# Patient Record
Sex: Female | Born: 1995 | Race: White | Hispanic: No | Marital: Single | Smoking: Never smoker
Health system: Southern US, Community
[De-identification: ages and names within clinical notes are randomized; demographics above are authoritative.]

## PROBLEM LIST (undated history)

## (undated) DIAGNOSIS — F419 Anxiety disorder, unspecified: Secondary | ICD-10-CM

## (undated) DIAGNOSIS — F5000 Anorexia nervosa, unspecified: Secondary | ICD-10-CM

## (undated) HISTORY — PX: DENTAL SURGERY: SHX609

## (undated) HISTORY — DX: Anorexia nervosa, unspecified: F50.00

## (undated) HISTORY — PX: BONE MARROW BIOPSY: SHX199

---
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
## 2017-12-29 ENCOUNTER — Other Ambulatory Visit: Payer: Self-pay

## 2017-12-29 ENCOUNTER — Emergency Department (HOSPITAL_BASED_OUTPATIENT_CLINIC_OR_DEPARTMENT_OTHER): Payer: BLUE CROSS/BLUE SHIELD

## 2017-12-29 ENCOUNTER — Encounter (HOSPITAL_BASED_OUTPATIENT_CLINIC_OR_DEPARTMENT_OTHER): Payer: Self-pay | Admitting: *Deleted

## 2017-12-29 ENCOUNTER — Emergency Department (HOSPITAL_BASED_OUTPATIENT_CLINIC_OR_DEPARTMENT_OTHER)
Admission: EM | Admit: 2017-12-29 | Discharge: 2017-12-30 | Disposition: A | Discharge status: Home / Self Care | Payer: BLUE CROSS/BLUE SHIELD | Attending: Emergency Medicine | Admitting: Emergency Medicine

## 2017-12-29 DIAGNOSIS — R070 Pain in throat: Secondary | ICD-10-CM | POA: Insufficient documentation

## 2017-12-29 DIAGNOSIS — J111 Influenza due to unidentified influenza virus with other respiratory manifestations: Secondary | ICD-10-CM

## 2017-12-29 DIAGNOSIS — R112 Nausea with vomiting, unspecified: Secondary | ICD-10-CM | POA: Insufficient documentation

## 2017-12-29 DIAGNOSIS — R6883 Chills (without fever): Secondary | ICD-10-CM | POA: Diagnosis not present

## 2017-12-29 DIAGNOSIS — R05 Cough: Secondary | ICD-10-CM | POA: Insufficient documentation

## 2017-12-29 DIAGNOSIS — R51 Headache: Secondary | ICD-10-CM | POA: Diagnosis present

## 2017-12-29 DIAGNOSIS — M791 Myalgia, unspecified site: Secondary | ICD-10-CM | POA: Diagnosis not present

## 2017-12-29 DIAGNOSIS — Z79899 Other long term (current) drug therapy: Secondary | ICD-10-CM | POA: Insufficient documentation

## 2017-12-29 HISTORY — DX: Anxiety disorder, unspecified: F41.9

## 2017-12-29 LAB — CBC WITH DIFFERENTIAL/PLATELET
BASOS ABS: 0 10*3/uL (ref 0.0–0.1)
Basophils Relative: 0 %
EOS ABS: 0 10*3/uL (ref 0.0–0.7)
Eosinophils Relative: 0 %
HCT: 41.7 % (ref 36.0–46.0)
HEMOGLOBIN: 15.4 g/dL — AB (ref 12.0–15.0)
Lymphocytes Relative: 8 %
Lymphs Abs: 0.7 10*3/uL (ref 0.7–4.0)
MCH: 31 pg (ref 26.0–34.0)
MCHC: 36.9 g/dL — AB (ref 30.0–36.0)
MCV: 84.1 fL (ref 78.0–100.0)
MONO ABS: 0.8 10*3/uL (ref 0.1–1.0)
MONOS PCT: 9 %
NEUTROS ABS: 7.1 10*3/uL (ref 1.7–7.7)
NEUTROS PCT: 83 %
Platelets: 196 10*3/uL (ref 150–400)
RBC: 4.96 MIL/uL (ref 3.87–5.11)
RDW: 12.9 % (ref 11.5–15.5)
WBC: 8.6 10*3/uL (ref 4.0–10.5)

## 2017-12-29 LAB — BASIC METABOLIC PANEL
Anion gap: 12 (ref 5–15)
BUN: 10 mg/dL (ref 6–20)
CO2: 22 mmol/L (ref 22–32)
CREATININE: 0.78 mg/dL (ref 0.44–1.00)
Calcium: 9.2 mg/dL (ref 8.9–10.3)
Chloride: 99 mmol/L — ABNORMAL LOW (ref 101–111)
GFR calc Af Amer: >60 mL/min (ref >60)
GFR calc non Af Amer: >60 mL/min (ref >60)
GLUCOSE: 172 mg/dL — AB (ref 65–99)
POTASSIUM: 3.7 mmol/L (ref 3.5–5.1)
SODIUM: 133 mmol/L — AB (ref 135–145)

## 2017-12-29 LAB — URINALYSIS, ROUTINE W REFLEX MICROSCOPIC
BILIRUBIN URINE: NEGATIVE
GLUCOSE, UA: NEGATIVE mg/dL
Ketones, ur: NEGATIVE mg/dL
Nitrite: NEGATIVE
PROTEIN: 30 mg/dL — AB
Specific Gravity, Urine: <1.005 — ABNORMAL LOW (ref 1.005–1.030)
pH: 6.5 (ref 5.0–8.0)

## 2017-12-29 LAB — URINALYSIS, MICROSCOPIC (REFLEX)

## 2017-12-29 LAB — HEPATIC FUNCTION PANEL
ALBUMIN: 3.8 g/dL (ref 3.5–5.0)
ALT: 12 U/L — AB (ref 14–54)
AST: 24 U/L (ref 15–41)
Alkaline Phosphatase: 63 U/L (ref 38–126)
BILIRUBIN TOTAL: 0.5 mg/dL (ref 0.3–1.2)
Bilirubin, Direct: 0.1 mg/dL (ref 0.1–0.5)
Indirect Bilirubin: 0.4 mg/dL (ref 0.3–0.9)
TOTAL PROTEIN: 7.5 g/dL (ref 6.5–8.1)

## 2017-12-29 LAB — LIPASE, BLOOD: Lipase: 21 U/L (ref 11–51)

## 2017-12-29 LAB — PREGNANCY, URINE: Preg Test, Ur: NEGATIVE

## 2017-12-29 MED ORDER — ONDANSETRON HCL 4 MG/2ML IJ SOLN
4.0000 mg | Freq: Once | INTRAMUSCULAR | Status: AC
  Administered 2017-12-29: 4 mg via INTRAVENOUS
  Filled 2017-12-29: qty 2

## 2017-12-29 MED ORDER — ACETAMINOPHEN 325 MG PO TABS
650.0000 mg | ORAL_TABLET | Freq: Once | ORAL | Status: AC
  Administered 2017-12-29: 650 mg via ORAL
  Filled 2017-12-29: qty 2

## 2017-12-29 MED ORDER — SODIUM CHLORIDE 0.9 % IV SOLN
INTRAVENOUS | Status: DC
  Administered 2017-12-30: via INTRAVENOUS

## 2017-12-29 MED ORDER — SODIUM CHLORIDE 0.9 % IV BOLUS
3000.0000 mL | Freq: Once | INTRAVENOUS | Status: AC
  Administered 2017-12-29: 2000 mL via INTRAVENOUS

## 2017-12-29 NOTE — ED Provider Notes (Signed)
[facility] EMERGENCY DEPARTMENT Provider Note   CSN: [id number] Arrival date & time: 12/29/17  1928     History   Chief Complaint Chief Complaint  Patient presents with  . Influenza    HPI Jade Mcintosh is a 22 y.o. female.  Patient with flulike illness onset on Tuesday.  Patient headache sore throat body aches nausea vomiting no diarrhea chills some diaphoresis cough but nonproductive.  Patient seen on Wednesday had positive flu test and started on Tamiflu.  Patient is not sure whether was influenza A or B.  Review of chart in epic does not confirm one way or the other does also does not show the visit.  Patient since that time is continued to have lots of nausea vomiting chills body aches just cannot get comfortable.  Patient states she had multiple episodes of vomiting.  Temperature upon arrival here was 100.3.  But then it went up significantly to 103.     Past Medical History:  Diagnosis Date  . Anxiety     Patient Active Problem List   Diagnosis Date Noted  . HIP PAIN, LEFT 10/29/2010    History reviewed. No pertinent surgical history.   OB History   None      Home Medications    Prior to Admission medications   Medication Sig Start Date End Date Taking? Authorizing Provider  cholecalciferol (VITAMIN D) 1000 units tablet Take 1,000 Units by mouth daily.   Yes [provider]  naproxen sodium (ALEVE) 220 MG tablet Take 220 mg by mouth.   Yes [provider]  venlafaxine XR (EFFEXOR-XR) 150 MG 24 hr capsule Take 150 mg by mouth daily with breakfast.   Yes [provider]  vitamin B-12 (CYANOCOBALAMIN) 100 MCG tablet Take 100 mcg by mouth daily.   Yes [provider]    Family History No family history on file.  Social History Social History   Tobacco Use  . Smoking status: Never Smoker  . Smokeless tobacco: Never Used  Substance Use Topics  . Alcohol use: Never    Frequency: Never  . Drug use: Never       Allergies   Patient has no known allergies.   Review of Systems Review of Systems  Constitutional: Positive for chills, diaphoresis and fever.  HENT: Positive for congestion and sore throat.   Eyes: Negative for visual disturbance.  Respiratory: Positive for cough. Negative for shortness of breath.   Cardiovascular: Negative for chest pain.  Gastrointestinal: Positive for nausea and vomiting. Negative for abdominal pain and diarrhea.  Genitourinary: Negative for dysuria.  Musculoskeletal: Positive for myalgias. Negative for neck stiffness.  Skin: Negative for rash.  Neurological: Negative for syncope and headaches.  Hematological: Does not bruise/bleed easily.  Psychiatric/Behavioral: Negative for confusion.     Physical Exam Updated Vital Signs BP 113/89 (BP Location: Right Arm)   Pulse (!) 140   Temp (!) 104.3 F (40.2 C) (Rectal)   Resp (!) 24   Ht 1.702 m ( )   Wt 59 kg (130 lb)   LMP 12/22/2017   SpO2 97%   BMI 20.36 kg/m   Physical Exam  Constitutional: She is oriented to person, place, and time. She appears well-developed and well-nourished. No distress.  HENT:  Head: Normocephalic and atraumatic.  Mouth/Throat: No oropharyngeal exudate.  Mucous membranes dry  Eyes: Pupils are equal, round, and reactive to light. Conjunctivae and EOM are normal.  Neck: Neck supple.  Cardiovascular: Normal heart sounds.  Tachycardic  Pulmonary/Chest: Effort normal and breath sounds normal. No respiratory distress. She has no wheezes. She has no rales.  Abdominal: Bowel sounds are normal. There is no tenderness.  Musculoskeletal: Normal range of motion. She exhibits no edema.  Neurological: She is alert and oriented to person, place, and time. A cranial nerve deficit is present. No sensory deficit. She exhibits normal muscle tone. Coordination normal.  Skin: Skin is warm. Capillary refill takes less than 2 seconds. No rash noted. No erythema.  Nursing note and vitals  reviewed.    ED Treatments / Results  Labs (all labs ordered are listed, but only abnormal results are displayed) Labs Reviewed  CBC WITH DIFFERENTIAL/PLATELET - Abnormal; Notable for the following components:      Result Value   Hemoglobin 15.4 (*)    MCHC 36.9 (*)    All other components within normal limits  BASIC METABOLIC PANEL - Abnormal; Notable for the following components:   Sodium 133 (*)    Chloride 99 (*)    Glucose, Bld 172 (*)    All other components within normal limits  URINALYSIS, ROUTINE W REFLEX MICROSCOPIC - Abnormal; Notable for the following components:   Specific Gravity, Urine <1.005 (*)    Hgb urine dipstick LARGE (*)    Protein, ur 30 (*)    Leukocytes, UA MODERATE (*)    All other components within normal limits  URINALYSIS, MICROSCOPIC (REFLEX) - Abnormal; Notable for the following components:   Bacteria, UA FEW (*)    All other components within normal limits  HEPATIC FUNCTION PANEL - Abnormal; Notable for the following components:   ALT 12 (*)    All other components within normal limits  PREGNANCY, URINE  LIPASE, BLOOD    EKG None  Radiology Dg Chest 2 View  Result Date: 12/29/2017 CLINICAL DATA:  Initial evaluation for acute nausea, vomiting, fever. EXAM: CHEST - 2 VIEW COMPARISON:  Prior radiograph from 07/28/2017. FINDINGS: The cardiac and mediastinal silhouettes are stable in size and contour, and remain within normal limits. The lungs are normally inflated. No airspace consolidation, pleural effusion, or pulmonary edema is identified. There is no pneumothorax. No acute osseous abnormality identified. IMPRESSION: No active cardiopulmonary disease. Electronically Signed   By: Rise Mu M.D.   On: 12/29/2017 22:31    Procedures Procedures (including critical care time)  Medications Ordered in ED Medications  0.9 %  sodium chloride infusion (has no administration in time range)  sodium chloride 0.9 % bolus 3,000 mL (2,000 mLs  Intravenous New Bag/Given 12/29/17 2231)  ondansetron (ZOFRAN) injection 4 mg (4 mg Intravenous Given 12/29/17 2236)  acetaminophen (TYLENOL) tablet 650 mg (650 mg Oral Given 12/29/17 2243)     Initial Impression / Assessment and Plan / ED Course  I have reviewed the triage vital signs and the nursing notes.  Pertinent labs & imaging results that were available during my care of the patient were reviewed by me and considered in my medical decision making (see chart for details).     Not able to confirm an influenza test being positive for certainly clinical symptoms seem to be consistent with it.  Patient's chest x-ray negative for pneumonia.  Labs without significant abnormalities no acidosis no significant leukocytosis white blood cell count is in the 8000 range which is normal.  Liver function test without significant abnormalities.  No evidence of urinary tract infection.  Pregnancy test negative.   Patient improved with IV fluids.  She already has Tamiflu to  take at home.  Will make sure she has antinausea medicine.  Follow-up with her primary care doctor recommended returning for any new or worse symptoms.  Final Clinical Impressions(s) / ED Diagnoses   Final diagnoses:  Influenza    ED Discharge Orders    None       Vanetta Mulders, MD 12/30/17 0000

## 2017-12-29 NOTE — ED Triage Notes (Signed)
She was diagnosed with flu 2 days ago. Fever, decreased appetite. Vomiting.

## 2017-12-30 MED ORDER — ONDANSETRON 4 MG PO TBDP: 4.0000 mg | ORAL_TABLET | Freq: Three times a day (TID) | ORAL | 1 refills | Status: DC | PRN

## 2017-12-30 MED ORDER — ONDANSETRON HCL 4 MG/2ML IJ SOLN
4.0000 mg | Freq: Once | INTRAMUSCULAR | Status: AC
  Administered 2017-12-30: 4 mg via INTRAVENOUS
  Filled 2017-12-30: qty 2

## 2017-12-30 NOTE — Discharge Instructions (Signed)
Return for any new or worse symptoms.  Take the Zofran for nausea vomiting.  Would recommend Motrin, Advil, Aleve, Naprosyn for the fevers and body aches.  Small amounts of liquids with sugar then advance to bland diet as tolerated.  School note provided.  Continue to take your Tamiflu.  Rest as much as possible.

## 2017-12-31 ENCOUNTER — Emergency Department (HOSPITAL_BASED_OUTPATIENT_CLINIC_OR_DEPARTMENT_OTHER)
Admission: EM | Admit: 2017-12-31 | Discharge: 2018-01-01 | Disposition: A | Discharge status: Home / Self Care | Payer: BLUE CROSS/BLUE SHIELD | Attending: Emergency Medicine | Admitting: Emergency Medicine

## 2017-12-31 ENCOUNTER — Encounter (HOSPITAL_BASED_OUTPATIENT_CLINIC_OR_DEPARTMENT_OTHER): Payer: Self-pay | Admitting: *Deleted

## 2017-12-31 ENCOUNTER — Other Ambulatory Visit: Payer: Self-pay

## 2017-12-31 ENCOUNTER — Emergency Department (HOSPITAL_BASED_OUTPATIENT_CLINIC_OR_DEPARTMENT_OTHER): Payer: BLUE CROSS/BLUE SHIELD

## 2017-12-31 DIAGNOSIS — M791 Myalgia, unspecified site: Secondary | ICD-10-CM | POA: Diagnosis not present

## 2017-12-31 DIAGNOSIS — R3915 Urgency of urination: Secondary | ICD-10-CM | POA: Insufficient documentation

## 2017-12-31 DIAGNOSIS — R109 Unspecified abdominal pain: Secondary | ICD-10-CM | POA: Diagnosis not present

## 2017-12-31 DIAGNOSIS — R1013 Epigastric pain: Secondary | ICD-10-CM | POA: Diagnosis not present

## 2017-12-31 DIAGNOSIS — R509 Fever, unspecified: Secondary | ICD-10-CM | POA: Diagnosis present

## 2017-12-31 DIAGNOSIS — R300 Dysuria: Secondary | ICD-10-CM | POA: Diagnosis not present

## 2017-12-31 DIAGNOSIS — N12 Tubulo-interstitial nephritis, not specified as acute or chronic: Secondary | ICD-10-CM | POA: Diagnosis not present

## 2017-12-31 DIAGNOSIS — R319 Hematuria, unspecified: Secondary | ICD-10-CM | POA: Diagnosis not present

## 2017-12-31 DIAGNOSIS — R1110 Vomiting, unspecified: Secondary | ICD-10-CM | POA: Diagnosis not present

## 2017-12-31 LAB — CBC WITH DIFFERENTIAL/PLATELET
Basophils Absolute: 0 10*3/uL (ref 0.0–0.1)
Basophils Relative: 0 %
Eosinophils Absolute: 0 10*3/uL (ref 0.0–0.7)
Eosinophils Relative: 0 %
HEMATOCRIT: 32.6 % — AB (ref 36.0–46.0)
HEMOGLOBIN: 11.7 g/dL — AB (ref 12.0–15.0)
LYMPHS PCT: 12 %
Lymphs Abs: 1 10*3/uL (ref 0.7–4.0)
MCH: 31 pg (ref 26.0–34.0)
MCHC: 35.9 g/dL (ref 30.0–36.0)
MCV: 86.2 fL (ref 78.0–100.0)
Monocytes Absolute: 0.7 10*3/uL (ref 0.1–1.0)
Monocytes Relative: 10 %
NEUTROS PCT: 78 %
Neutro Abs: 6.1 10*3/uL (ref 1.7–7.7)
Platelets: 151 10*3/uL (ref 150–400)
RBC: 3.78 MIL/uL — AB (ref 3.87–5.11)
RDW: 13 % (ref 11.5–15.5)
WBC: 7.8 10*3/uL (ref 4.0–10.5)

## 2017-12-31 LAB — PREGNANCY, URINE: PREG TEST UR: NEGATIVE

## 2017-12-31 LAB — COMPREHENSIVE METABOLIC PANEL
ALT: 16 U/L (ref 14–54)
AST: 29 U/L (ref 15–41)
Albumin: 2.9 g/dL — ABNORMAL LOW (ref 3.5–5.0)
Alkaline Phosphatase: 61 U/L (ref 38–126)
Anion gap: 8 (ref 5–15)
BUN: 11 mg/dL (ref 6–20)
CO2: 19 mmol/L — AB (ref 22–32)
Calcium: 7.7 mg/dL — ABNORMAL LOW (ref 8.9–10.3)
Chloride: 105 mmol/L (ref 101–111)
Creatinine, Ser: 0.62 mg/dL (ref 0.44–1.00)
Glucose, Bld: 126 mg/dL — ABNORMAL HIGH (ref 65–99)
POTASSIUM: 3.5 mmol/L (ref 3.5–5.1)
SODIUM: 132 mmol/L — AB (ref 135–145)
Total Bilirubin: 0.4 mg/dL (ref 0.3–1.2)
Total Protein: 5.8 g/dL — ABNORMAL LOW (ref 6.5–8.1)

## 2017-12-31 LAB — I-STAT CG4 LACTIC ACID, ED: LACTIC ACID, VENOUS: 1.62 mmol/L (ref 0.5–1.9)

## 2017-12-31 LAB — URINALYSIS, MICROSCOPIC (REFLEX)
RBC / HPF: >50 RBC/hpf (ref 0–5)
WBC, UA: >50 WBC/hpf (ref 0–5)

## 2017-12-31 LAB — URINALYSIS, ROUTINE W REFLEX MICROSCOPIC
GLUCOSE, UA: 100 mg/dL — AB
Ketones, ur: 15 mg/dL — AB
Nitrite: NEGATIVE
PH: 8 (ref 5.0–8.0)
Protein, ur: >300 mg/dL — AB
SPECIFIC GRAVITY, URINE: 1.02 (ref 1.005–1.030)

## 2017-12-31 MED ORDER — SODIUM CHLORIDE 0.9 % IV SOLN
1.0000 g | Freq: Once | INTRAVENOUS | Status: AC
  Administered 2017-12-31: 1 g via INTRAVENOUS
  Filled 2017-12-31: qty 10

## 2017-12-31 MED ORDER — LACTATED RINGERS IV BOLUS
1000.0000 mL | Freq: Once | INTRAVENOUS | Status: AC
  Administered 2017-12-31: 1000 mL via INTRAVENOUS

## 2017-12-31 MED ORDER — ACETAMINOPHEN 325 MG PO TABS
650.0000 mg | ORAL_TABLET | Freq: Once | ORAL | Status: AC | PRN
  Administered 2017-12-31: 650 mg via ORAL
  Filled 2017-12-31: qty 2

## 2017-12-31 NOTE — ED Provider Notes (Signed)
Emergency Department Provider Note   I have reviewed the triage vital signs and the nursing notes.   HISTORY  Chief Complaint Influenza   HPI Jade Mcintosh is a 22 y.o. female without significant past medical history the presents to the emergency department today with fever, right flank pain.  Patient states that she was seen back on Tuesday in [place] and found to have influenza as she started on Tamiflu but then started having some vomiting.  Her fever came back and her aches are continuous and she came in for evaluation here 2 days ago.  At that time work-up was done, fluids were given and her vital signs seem to improve and she was feeling better however since she is got home she had onset of dysuria, right flank pain, urgency, hematuria and incomplete voiding.  Still having some nausea and epigastric pain as well.  Has had a couple episodes where her body shakes with a fever.  Has had decreased appetite.  No other symptoms. No other associated or modifying symptoms.    Past Medical History:  Diagnosis Date  . Anxiety     Patient Active Problem List   Diagnosis Date Noted  . HIP PAIN, LEFT 10/29/2010    Past Surgical History:  Procedure Laterality Date  . BONE MARROW BIOPSY    . DENTAL SURGERY      Current Outpatient Rx  . Order #: [id number] Class: Historical Med  . Order #: [id number] Class: Historical Med  . Order #: [id number] Class: Print  . Order #: [id number] Class: Historical Med  . Order #: [id number] Class: Print  . Order #: [id number] Class: Print  . Order #: [id number] Class: Historical Med    Allergies Patient has no known allergies.  No family history on file.  Social History Social History   Tobacco Use  . Smoking status: Never Smoker  . Smokeless tobacco: Never Used  Substance Use Topics  . Alcohol use: Never    Frequency: Never  . Drug use: Never    Review of Systems  All other systems negative except as documented in the HPI. All pertinent  positives and negatives as reviewed in the HPI. ____________________________________________   PHYSICAL EXAM:  VITAL SIGNS: ED Triage Vitals  Enc Vitals Group     BP 12/31/17 2059 132/82     Pulse Rate 12/31/17 2059 (!) 144     Resp 12/31/17 2059 14     Temp 12/31/17 2059 (!) 103.3 F (39.6 C)     Temp Source 12/31/17 2059 Oral     SpO2 12/31/17 2059 100 %     Weight 12/31/17 2053 130 lb (59 kg)     Height 12/31/17 2053 '5\' 7"'$  (1.702 m)    Constitutional: Alert and oriented. Well appearing and in no acute distress.  Slight anxiousness. Eyes: Conjunctivae are normal. PERRL. EOMI. Head: Atraumatic. Nose: No congestion/rhinnorhea. Mouth/Throat: Mucous membranes are moist.  Oropharynx non-erythematous. Neck: No stridor.  No meningeal signs.   Cardiovascular: Tachycardic rate, regular rhythm. Good peripheral circulation. Grossly normal heart sounds.   Respiratory: Tachypneic respiratory effort.  No retractions. Lungs CTAB. Gastrointestinal: Soft and nontender. No distention.  Musculoskeletal: No lower extremity tenderness nor edema. No gross deformities of extremities. Neurologic:  Normal speech and language. No gross focal neurologic deficits are appreciated.  Skin:  Skin is hot, moist and intact. No rash noted.   ____________________________________________   LABS (all labs ordered are listed, but only abnormal results are displayed)  Labs Reviewed  COMPREHENSIVE METABOLIC PANEL - Abnormal;  Notable for the following components:      Result Value   Sodium 132 (*)    CO2 19 (*)    Glucose, Bld 126 (*)    Calcium 7.7 (*)    Total Protein 5.8 (*)    Albumin 2.9 (*)    All other components within normal limits  CBC WITH DIFFERENTIAL/PLATELET - Abnormal; Notable for the following components:   RBC 3.78 (*)    Hemoglobin 11.7 (*)    HCT 32.6 (*)    All other components within normal limits  URINALYSIS, ROUTINE W REFLEX MICROSCOPIC - Abnormal; Notable for the following  components:   Color, Urine RED (*)    APPearance CLOUDY (*)    Glucose, UA 100 (*)    Hgb urine dipstick LARGE (*)    Bilirubin Urine SMALL (*)    Ketones, ur 15 (*)    Protein, ur >300 (*)    Leukocytes, UA TRACE (*)    All other components within normal limits  URINALYSIS, MICROSCOPIC (REFLEX) - Abnormal; Notable for the following components:   Bacteria, UA MANY (*)    All other components within normal limits  URINE CULTURE  CULTURE, BLOOD (ROUTINE X 2)  CULTURE, BLOOD (ROUTINE X 2)  PREGNANCY, URINE  I-STAT CG4 LACTIC ACID, ED   ____________________________________________   RADIOLOGY  Dg Chest 2 View  Result Date: 12/31/2017 CLINICAL DATA:  22 year old female with fever and cough. EXAM: CHEST - 2 VIEW COMPARISON:  Chest radiograph dated 12/29/2017 FINDINGS: The heart size and mediastinal contours are within normal limits. Both lungs are clear. The visualized skeletal structures are unremarkable. IMPRESSION: No active cardiopulmonary disease. Electronically Signed   By: Anner Crete M.D.   On: 12/31/2017 22:23    ____________________________________________   PROCEDURES  Procedure(s) performed:   Procedures   ____________________________________________   INITIAL IMPRESSION / ASSESSMENT AND PLAN / ED COURSE  Likely pyelo. Will give fluids/abx/antipyretics. eval cxr to ensure not a worsening pneumonia or developing pneumonia with her cough/cp.   Urine consistent with infection. Culture sent. Fluids started, HR improving. Symptoms improving.   Recheck and abdomen benign. Still some flank ttp c/w pyelonephritis. HR normal. toleratign PO. Will dc on abx/pain meds/nausea meds. A couple days of rest. otherwise stable for discharge and return to normal activities when she feels ready.    Pertinent labs & imaging results that were available during my care of the patient were reviewed by me and considered in my medical decision making (see chart for  details).  ____________________________________________  FINAL CLINICAL IMPRESSION(S) / ED DIAGNOSES  Final diagnoses:  Pyelonephritis     MEDICATIONS GIVEN DURING THIS VISIT:  Medications  acetaminophen (TYLENOL) tablet 650 mg (650 mg Oral Given 12/31/17 2104)  lactated ringers bolus 1,000 mL (0 mLs Intravenous Stopped 01/01/18 0013)  lactated ringers bolus 1,000 mL (0 mLs Intravenous Stopped 01/01/18 0013)  cefTRIAXone (ROCEPHIN) 1 g in sodium chloride 0.9 % 100 mL IVPB (0 g Intravenous Stopped 12/31/17 2245)     NEW OUTPATIENT MEDICATIONS STARTED DURING THIS VISIT:  Discharge Medication List as of 01/01/2018 12:05 AM    START taking these medications   Details  cephALEXin (KEFLEX) 500 MG capsule Take 1 capsule (500 mg total) by mouth 4 (four) times daily., Starting Mon 01/01/2018, Print    ondansetron (ZOFRAN) 4 MG tablet Take 1 tablet (4 mg total) by mouth every 8 (eight) hours as needed for nausea or vomiting., Starting Mon 01/01/2018, Print  Note:  This note was prepared with assistance of Dragon voice recognition software. Occasional wrong-word or sound-a-like substitutions may have occurred due to the inherent limitations of voice recognition software.   Merrily Pew, MD 01/01/18 [phone]

## 2017-12-31 NOTE — ED Triage Notes (Signed)
Pt dx with flu on Wednesday. Seen here Friday night and received NS x 3L. Here today because she still feels bad and she has a new cough and rib pain

## 2017-12-31 NOTE — ED Notes (Signed)
ED Provider at bedside. 

## 2018-01-01 MED ORDER — CEPHALEXIN 500 MG PO CAPS
500.0000 mg | ORAL_CAPSULE | Freq: Four times a day (QID) | ORAL | 0 refills | Status: DC
Start: 2018-01-01 — End: 2018-07-20

## 2018-01-01 MED ORDER — ONDANSETRON HCL 4 MG PO TABS: 4.0000 mg | ORAL_TABLET | Freq: Three times a day (TID) | ORAL | 0 refills | Status: DC | PRN

## 2018-01-03 LAB — URINE CULTURE

## 2018-01-04 ENCOUNTER — Telehealth: Payer: Self-pay | Admitting: *Deleted

## 2018-01-04 NOTE — Progress Notes (Signed)
ED Antimicrobial Stewardship Positive Culture Follow Up   Jade Mcintosh is an 22 y.o. female who presented to [facility] on 12/31/2017 with a chief complaint of  Chief Complaint  Patient presents with  . Influenza    Recent Results (from the past 720 hour(s))  Urine culture     Status: Abnormal   Collection Time: 12/31/17  9:05 PM  Result Value Ref Range Status   Specimen Description   Final    URINE, RANDOM Performed at [facility], [address] [place], [place] [address]    Special Requests   Final    NONE Performed at [facility], [address] [place], [place] [address]    Culture 70,000 COLONIES/mL ENTEROBACTER CLOACAE (A)  Final   Report Status 01/03/2018 FINAL  Final   Organism ID, Bacteria ENTEROBACTER CLOACAE (A)  Final      Susceptibility   Enterobacter cloacae - MIC*    CEFAZOLIN >=64 RESISTANT Resistant     CEFTRIAXONE <=1 SENSITIVE Sensitive     CIPROFLOXACIN <=0.25 SENSITIVE Sensitive     GENTAMICIN <=1 SENSITIVE Sensitive     IMIPENEM 1 SENSITIVE Sensitive     NITROFURANTOIN 32 SENSITIVE Sensitive     TRIMETH/SULFA <=20 SENSITIVE Sensitive     PIP/TAZO <=4 SENSITIVE Sensitive     * 70,000 COLONIES/mL ENTEROBACTER CLOACAE  Blood culture (routine x 2)     Status: None (Preliminary result)   Collection Time: 12/31/17  9:30 PM  Result Value Ref Range Status   Specimen Description   Final    BLOOD RIGHT ARM Performed at [facility], [address] [place], [place] [address]    Special Requests   Final    BOTTLES DRAWN AEROBIC AND ANAEROBIC Blood Culture adequate volume Performed at [facility], [address] [place], [place] [address]    Culture   Final    NO GROWTH 2 DAYS Performed at [facility], [address] [place], [place] [address]    Report Status PENDING  Incomplete  Blood culture (routine x 2)     Status: None (Preliminary result)   Collection Time: 12/31/17 10:11 PM  Result  Value Ref Range Status   Specimen Description   Final    BLOOD LEFT ARM Performed at [facility], [address] [place], [place] [address]    Special Requests   Final    BOTTLES DRAWN AEROBIC AND ANAEROBIC Blood Culture adequate volume Performed at [facility], [address] [place], [place] [address]    Culture   Final    NO GROWTH 2 DAYS Performed at [facility], [address], [place], [place] [address]    Report Status PENDING  Incomplete     Treated with cephalexin, organism resistant to prescribed antimicrobial   New antibiotic prescription: Start cefdinir  PO BID x 7 days  ED Provider: Harvie Heck PA-C   [name] 01/04/2018, 9:17 AM Infectious Diseases Pharmacist Phone# [phone]

## 2018-01-04 NOTE — Telephone Encounter (Signed)
Post ED Visit - Positive Culture Follow-up: Unsuccessful Patient Follow-up  Culture assessed and recommendations reviewed by:   Enzo Bi, Pharm.D.  Celedonio Miyamoto, Pharm.D., BCPS AQ-ID  Garvin Fila, Pharm.D., BCPS  Georgina Pillion, [address].D., BCPS  [place], [address].D., BCPS, AAHIVP  Estella Husk, Pharm.D., BCPS, AAHIVP  Sherlynn Carbon, PharmD  Pollyann Samples, PharmD, BCPS  Positive urine culture, reviewed by Lenora Boys, PA-C   Patient discharged without antimicrobial prescription and treatment is now indicated  Organism is resistant to prescribed ED discharge antimicrobial.  Stop Cephalexin and Start Cefdinir  PO BID x 7 days  Patient with positive blood cultures   Unable to contact patient after 3 attempts, letter will be sent to address on file  Lysle Pearl 01/04/2018, 9:48 AM

## 2018-01-06 LAB — CULTURE, BLOOD (ROUTINE X 2)
CULTURE: NO GROWTH
CULTURE: NO GROWTH
SPECIAL REQUESTS: ADEQUATE
Special Requests: ADEQUATE

## 2018-05-12 ENCOUNTER — Encounter: Payer: Self-pay | Admitting: *Deleted

## 2018-06-01 ENCOUNTER — Ambulatory Visit: Payer: BLUE CROSS/BLUE SHIELD | Admitting: Psychiatry

## 2018-06-01 DIAGNOSIS — F329 Major depressive disorder, single episode, unspecified: Secondary | ICD-10-CM

## 2018-06-01 DIAGNOSIS — F32A Depression, unspecified: Secondary | ICD-10-CM

## 2018-06-01 MED ORDER — SERTRALINE HCL 100 MG PO TABS: 100.0000 mg | ORAL_TABLET | Freq: Every day | ORAL | 2 refills | Status: DC

## 2018-06-01 NOTE — Patient Instructions (Signed)
Patient will increase her Zoloft from 50 to 100 mg.

## 2018-06-01 NOTE — Progress Notes (Signed)
[facility]  Patient ID: Jade Mcintosh,  MRN: [id number]  PCP: Johny Blamer, MD  Date of Evaluation: 06/01/2018 Time spent:20 minutes   Patient is 22 year old white female.  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  Continues to have depression which she feels is situational  does have crying spells, motivation varies denies suicidal thoughts.  Her anxiety has increased also since she is off the [employer].  Patient has started having panic attacks without chest pain.  We were going to start her on lithium.  Patient has not started.  Instead we will increase her Zoloft to 100 mg   HISTORY/CURRENT STATUS: HPI  Individual Medical History/ Review of Systems: Changes? :No  Allergies: Patient has no known allergies.  Current Medications:  Current Outpatient Medications:  .  cholecalciferol (VITAMIN D) 1000 units tablet, Take 1,000 Units by mouth daily., Disp: , Rfl:  .  naproxen sodium (ALEVE) 220 MG tablet, Take 220 mg by mouth., Disp: , Rfl:  .  Norgestimate-Ethinyl Estradiol Triphasic (TRI-SPRINTEC) 0.18/0.215/0.25 MG-35 MCG tablet, Take 1 tablet by mouth daily., Disp: , Rfl:  .  sertraline (ZOLOFT) 100 MG tablet, Take 1 tablet (100 mg total) by mouth daily., Disp: 30 tablet, Rfl: 2 .  vitamin B-12 (CYANOCOBALAMIN) 100 MCG tablet, Take 100 mcg by mouth daily., Disp: , Rfl:  .  cephALEXin (KEFLEX) 500 MG capsule, Take 1 capsule (500 mg total) by mouth 4 (four) times daily. (Patient not taking: Reported on 06/01/2018), Disp: 40 capsule, Rfl: 0 .  ondansetron (ZOFRAN ODT) 4 MG disintegrating tablet, Take 1 tablet (4 mg total) by mouth every 8 (eight) hours as needed. (Patient not taking: Reported on 06/01/2018), Disp: 10 tablet, Rfl: 1 .  ondansetron (ZOFRAN) 4 MG tablet, Take 1 tablet (4 mg total) by mouth every 8 (eight) hours as needed for nausea or vomiting. (Patient not taking: Reported on 06/01/2018), Disp: 12 tablet, Rfl: 0 Medication Side  Effects: None  Family Medical/ Social History: Changes? No  MENTAL HEALTH EXAM:  There were no vitals taken for this visit.There is no height or weight on file to calculate BMI.  General Appearance: Casual  Eye Contact:  Good  Speech:  Normal Rate  Volume:  Normal  Mood:  Euthymic  Affect:  Congruent  Thought Process:  Coherent  Orientation:  Full (Time, Place, and Person)  Thought Content: WDL   Suicidal Thoughts:  No  Homicidal Thoughts:  No  Memory:  Immediate  Judgement:  Good  Insight:  Good  Psychomotor Activity:  Normal  Concentration:  Concentration: Good  Recall:  Good  Fund of Knowledge: Good  Language: Good  Akathisia:  NA  AIMS (if indicated): na  Assets:  Leisure Time Social Support  ADL's:  Intact  Cognition: WNL  Prognosis:  Good    DIAGNOSES:    ICD-10-CM   1. Depression, unspecified depression type F32.9 sertraline (ZOLOFT) 100 MG tablet    RECOMMENDATIONS: Patient is to increase Zoloft to 100 mg a day.  She is to return in 3 months.    Anne Fu, PA-C

## 2018-06-05 ENCOUNTER — Telehealth: Payer: Self-pay | Admitting: Psychiatry

## 2018-06-05 NOTE — Telephone Encounter (Signed)
Ok to switch providers

## 2018-06-05 NOTE — Telephone Encounter (Signed)
Please put chart in my box.  Thanks!

## 2018-06-05 NOTE — Telephone Encounter (Signed)
Pt called she feels she would be more comfortable seeing a  female provider. Ask to switch to Melony Overly from [facility]. Please advise.

## 2018-06-06 ENCOUNTER — Other Ambulatory Visit: Payer: Self-pay

## 2018-06-06 DIAGNOSIS — F329 Major depressive disorder, single episode, unspecified: Secondary | ICD-10-CM

## 2018-06-06 DIAGNOSIS — F32A Depression, unspecified: Secondary | ICD-10-CM

## 2018-06-06 MED ORDER — SERTRALINE HCL 100 MG PO TABS: 100.0000 mg | ORAL_TABLET | Freq: Every day | ORAL | 0 refills | Status: DC

## 2018-06-08 ENCOUNTER — Other Ambulatory Visit: Payer: Self-pay

## 2018-06-08 DIAGNOSIS — F329 Major depressive disorder, single episode, unspecified: Secondary | ICD-10-CM

## 2018-06-08 DIAGNOSIS — F32A Depression, unspecified: Secondary | ICD-10-CM

## 2018-06-08 MED ORDER — SERTRALINE HCL 50 MG PO TABS: 50.0000 mg | ORAL_TABLET | Freq: Every day | ORAL | 0 refills | Status: DC

## 2018-07-04 ENCOUNTER — Other Ambulatory Visit: Payer: Self-pay | Admitting: Psychiatry

## 2018-07-04 ENCOUNTER — Other Ambulatory Visit: Payer: Self-pay

## 2018-07-04 DIAGNOSIS — F32A Depression, unspecified: Secondary | ICD-10-CM

## 2018-07-04 DIAGNOSIS — F329 Major depressive disorder, single episode, unspecified: Secondary | ICD-10-CM

## 2018-07-04 MED ORDER — SERTRALINE HCL 100 MG PO TABS: 100.0000 mg | ORAL_TABLET | Freq: Every day | ORAL | 0 refills | Status: DC

## 2018-07-06 ENCOUNTER — Telehealth: Payer: Self-pay | Admitting: Psychiatry

## 2018-07-06 MED ORDER — SERTRALINE HCL 100 MG PO TABS: 100.0000 mg | ORAL_TABLET | Freq: Every day | ORAL | 0 refills | Status: DC

## 2018-07-06 NOTE — Telephone Encounter (Signed)
eprescribed zoloft 100mg  qd

## 2018-07-09 ENCOUNTER — Other Ambulatory Visit: Payer: Self-pay | Admitting: Psychiatry

## 2018-07-10 ENCOUNTER — Telehealth: Payer: Self-pay | Admitting: Psychiatry

## 2018-07-10 NOTE — Telephone Encounter (Signed)
Spoke with patient today. Just filled Zoloft 100mg /day yesterday. To go ahead and start Lithium for mood stability

## 2018-07-20 ENCOUNTER — Ambulatory Visit: Payer: BLUE CROSS/BLUE SHIELD | Admitting: Physician Assistant

## 2018-07-20 ENCOUNTER — Ambulatory Visit: Payer: BLUE CROSS/BLUE SHIELD | Admitting: Mental Health

## 2018-07-20 ENCOUNTER — Encounter: Payer: Self-pay | Admitting: Physician Assistant

## 2018-07-20 VITALS — BP 121/70 | HR 72 | Ht 67.0 in | Wt 139.0 lb

## 2018-07-20 DIAGNOSIS — F411 Generalized anxiety disorder: Secondary | ICD-10-CM | POA: Insufficient documentation

## 2018-07-20 DIAGNOSIS — F4522 Body dysmorphic disorder: Secondary | ICD-10-CM | POA: Diagnosis not present

## 2018-07-20 DIAGNOSIS — Z789 Other specified health status: Secondary | ICD-10-CM | POA: Insufficient documentation

## 2018-07-20 DIAGNOSIS — F5000 Anorexia nervosa, unspecified: Secondary | ICD-10-CM | POA: Insufficient documentation

## 2018-07-20 MED ORDER — PROPRANOLOL HCL 10 MG PO TABS: 10.0000 mg | ORAL_TABLET | Freq: Two times a day (BID) | ORAL | 1 refills | Status: DC | PRN

## 2018-07-20 MED ORDER — SERTRALINE HCL 100 MG PO TABS: 150.0000 mg | ORAL_TABLET | Freq: Every day | ORAL | 1 refills | Status: DC

## 2018-07-20 NOTE — Progress Notes (Signed)
[facility] MD/PA/NP Initial Note  07/20/2018 2:51 PM Jade Mcintosh  MRN:  [id number]  Chief Complaint:  Chief Complaint    Anxiety; Depression      HPI: Has had episodic anxiety.  But the main reason she's here is b/c of low self-confidence.  At about 22 yrs old, she hated everything about herself.  Thought about having a nose job even at that age. " I thought it would make me feel better.  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 My mom is a flight attendant and gone a lot, so I had to deal with all that on my own.  My dad lost his job that year, and I wasn't eating a lot.  I lost a bunch of weight and didn't have my period for a year and half."  Never binge/purge but restricted calories, but doesn't do that now.  In periods of high stress, will do it sometimes, but nothing like it used to be.    Anxiety is some better.  "I've learned to deal with it, I guess. I think the Zoloft has helped."  Gets test anxiety to the point of shaking, having racing thoughts, and will even throw up a few days before a test or something. Feels that it's warranted most of the time.  If she distracts herself with watching tv or something, and feels better.   Sleeps good. But is tired all the time. 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  Patient denies loss of interest in usual activities and is able to enjoy things.  Has decreased energy and motivation.  But unsure if it's because she's a busy college student or what. Appetite has not changed.  No extreme sadness, but does complain of crying easily at times, or feelings of hopelessness.  Denies any changes in concentration, making decisions or remembering things.  Denies suicidal or homicidal thoughts.  She has seen Dr. Ken Frazier counseling in the past and then another counselor in [place] since she has been at school.  She is felt like neither has been a good fit and would like to try someone else.  States she  knows that therapy is going to be more important for body image issues the medication well.  Visit Diagnosis:    ICD-10-CM   1. Generalized anxiety disorder F41.1   2. 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 Anorexia nervosa   . Anxiety   She is never been hospitalized for any psychiatric reasons.  Past Surgical History:  Procedure Laterality Date  . BONE MARROW BIOPSY    . DENTAL SURGERY      Family Psychiatric History:   Family History:  Family History  Problem Relation Age of Onset  . Depression Brother   Thinks an aunt may have bipolar disorder but never diagnosed.  Social History:  Social History   Socioeconomic History  . Marital status: Single    Spouse name: Not on file  . Number of children: Not on file  . Years of education: Not on file  . Highest education level: 12th grade  Occupational History  . Occupation: student   Social Needs  . Financial resource strain: Not hard at all  . Food insecurity:    Worry: Never true    Inability: Never true  . Transportation needs:      Medical: No    Non-medical: No  Tobacco Use  . Smoking status: Never Smoker  . Smokeless tobacco: Never Used  Substance and Sexual Activity  . Alcohol use: Yes    Frequency: Never    Comment: weekends  . Drug use: Never  . Sexual activity: Not on file  Lifestyle  . Physical activity:    Days per week: 0 days    Minutes per session: 0 min  . Stress: Very much  Relationships  . Social connections:    Talks on phone: More than three times a week    Gets together: More than three times a week    Attends religious service: Not on file    Active member of club or organization: Yes    Attends meetings of clubs or organizations: More than 4 times per year    Relationship status: Never married  Other Topics Concern  . Not on file  Social History Narrative   [employer], studying  Nutrition with a minor in Psychology.    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 Venlafaxine Nausea Only    Sweats    Metabolic Disorder Labs: No results found for: HGBA1C, MPG No results found for: PROLACTIN No results found for: CHOL, TRIG, HDL, CHOLHDL, VLDL, LDLCALC No results found for: TSH  Therapeutic Level Labs: No results found for: LITHIUM No results found for: VALPROATE No components found for:  CBMZ  Current Medications: Current Outpatient Medications  Medication Sig Dispense Refill  . cholecalciferol (VITAMIN D) 1000 units tablet Take 1,000 Units by mouth daily.    . naproxen sodium (ALEVE) 220 MG tablet Take 220 mg by mouth.    . Norgestimate-Ethinyl Estradiol Triphasic (TRI-SPRINTEC) 0.18/0.215/0.25 MG-35 MCG tablet Take 1 tablet by mouth daily.    . vitamin B-12 (CYANOCOBALAMIN) 100 MCG tablet Take 100 mcg by mouth daily.    . propranolol (INDERAL) 10 MG tablet Take 1-2 tablets (10-20 mg total) by mouth 2 (two) times daily as needed. 60 tablet 1  . sertraline (ZOLOFT) 100 MG tablet Take 1.5 tablets (150 mg total) by mouth daily. 45 tablet 1   No current facility-administered medications for this visit.     Medication Side Effects: none  Orders placed this visit:  No orders of the defined types were placed in this encounter.   Psychiatric Specialty Exam:  ROS  There were no vitals taken for this visit.There is no height or weight on file to calculate BMI.  General Appearance: Casual  Eye Contact:  Good  Speech:  Clear and Coherent  Volume:  Normal  Mood:  Euthymic  Affect:  Appropriate  Thought Process:  Goal Directed  Orientation:  Full (Time, Place, and Person)  Thought Content: Logical   Suicidal Thoughts:  No  Homicidal Thoughts:  No  Memory:  WNL  Judgement:  Good  Insight:  Good  Psychomotor Activity:  Normal  Concentration:  Concentration: Good and Attention Span:  Good  Recall:  Good  Fund of Knowledge: Good  Language: Good  Assets:  Desire for Improvement  ADL's:  Intact  Cognition: WNL  Prognosis:  Good   Screenings:  GAD-7     Office Visit from 07/20/2018 in [facility]  Total GAD-7 Score  13    PHQ2-9     Office Visit from 07/20/2018 in [facility]    PHQ-2 Total Score  4  PHQ-9 Total Score  12      Receiving Psychotherapy: No   Treatment Plan/Recommendations: Increase Zoloft to 150 mg daily. Start propranolol 10 mg Sig 1-2 twice daily as needed panic attack.  She knows to watch for hypotension symptoms such as dizziness or weakness.  If that occurs, do not take the medication anymore and call me. Refer for psychotherapy here in our office.  Return in 4 to 6 weeks.     Teresa Hurst, PA-C           

## 2018-07-25 ENCOUNTER — Telehealth: Payer: Self-pay | Admitting: Physician Assistant

## 2018-07-25 ENCOUNTER — Other Ambulatory Visit: Payer: Self-pay

## 2018-07-25 MED ORDER — PROPRANOLOL HCL 10 MG PO TABS: 10.0000 mg | ORAL_TABLET | Freq: Two times a day (BID) | ORAL | 0 refills | Status: DC | PRN

## 2018-07-25 NOTE — Telephone Encounter (Signed)
Pt called she is home from college.She left her propranolol 10 mg tabs 1-2 prn  for anxiety. [facility] [phone]. She is home for 5 days. Having a lot of anxiety.

## 2018-07-25 NOTE — Telephone Encounter (Signed)
Left voicemail to check with pharmacy and let her know insurance might not cover since she just filled it.

## 2018-07-25 NOTE — Telephone Encounter (Signed)
[facility] for her to pick up some.

## 2018-08-03 ENCOUNTER — Ambulatory Visit: Payer: BLUE CROSS/BLUE SHIELD | Admitting: Mental Health

## 2018-08-08 ENCOUNTER — Ambulatory Visit (INDEPENDENT_AMBULATORY_CARE_PROVIDER_SITE_OTHER): Payer: BLUE CROSS/BLUE SHIELD | Admitting: Mental Health

## 2018-08-08 DIAGNOSIS — F411 Generalized anxiety disorder: Secondary | ICD-10-CM

## 2018-08-08 NOTE — Telephone Encounter (Signed)
ERROR

## 2018-08-08 NOTE — Progress Notes (Signed)
[facility] Counselor Initial Adult Exam- Part I  Name: Jade Mcintosh Date: 08/08/2018 MRN: [id number] DOB: [birth year] PCP: Shirline Frees, MD  Time spent: 53 minutes   Guardian/Payee: none  Paperwork requested:  n/a  Reason for Visit /Presenting Problem:  Pt had been in therapy last year for a couple of sessions. Her brother has a hx of selling drugs and was expelled from college and then attempted suicide, going inpatient to stabilize. Pt developed "somewhat of an eating disorder" as she was a competition dancing, very competitive. She would limit herself to 1300 calories /day but dancing many hours/week and also running. Due to not having her monthly menstrual cycle for about a year, she went to [facility] and was misdiagnosed w/ Poly Cystic Ovarian. She got a 2nd opinion and was told to eat more often and Pt followed through, able to gain some weight.  She still weighs herself and is conscious of her weight.  She has struggled since 7th grade. She stated she is over conscious about her looks, feels she has many unattractive qualities, feels she is unattractive. She researched body dysmorphia and feels she has the symptoms. She stated her past therapy, she was not as disclosing. She is struggling emotionally lately. She has not been to the gym for the past 4 months, feels this has thrown her off. She has had difficult classes and it has taken a lot of her time. She has felt more sad lately, unsure this is b/c of school demands or the medication as she had a recent mg increase (zoloft). She is taking Propanolol to decrease anxiety. She has a 4.0gpa in school, she pushes herself often. She is majoring in Occupational psychologist. She was a vegan at age 5 and is today. She is trying to find a balance w/ her healthy eating habits but worries when she eats something that is not healthy "am I doing this too much". The anxiety , started at age 62.  Family hx for MH- Brother, gfM- self critical about her weight in the  past. Raised by gfM as mother was a Catering manager.    Mental Status Exam:   Appearance:   Casual     Behavior:  Appropriate  Motor:  Normal  Speech/Language:   Clear and Coherent  Affect:  anxious  Mood:  congruent  Thought process:  normal  Thought content:    WNL  Sensory/Perceptual disturbances:    WNL  Orientation:  oriented to person, place, time/date and situation  Attention:  NA  Concentration:  Good  Memory:  WNL  Fund of knowledge:   Good  Insight:    Good  Judgment:   Good  Impulse Control:  Good   Reported Symptoms:  Anxiety, negative self talk, shaming self,   Risk Assessment: Danger to Self:  No Self-injurious Behavior: No Danger to Others: No Duty to Warn:no Physical Aggression / Violence:No  Access to Firearms a concern: No  Gang Involvement:No  Patient / guardian was educated about steps to take if suicide or homicide risk level increases between visits: yes While future psychiatric events cannot be accurately predicted, the patient does not currently require acute inpatient psychiatric care and does not currently meet [facility] involuntary commitment criteria.  Substance Abuse History: Current substance abuse: No     Past Psychiatric History:   Previous psychological history is significant for anxiety Outpatient Providers:Ken Ginger Carne - past therapists History of Psych Hospitalization: No  Psychological Testing: none   Medical History/Surgical History:reviewed  Past Medical History:  Diagnosis Date  . Anorexia nervosa   . Anxiety     Past Surgical History:  Procedure Laterality Date  . BONE MARROW BIOPSY    . DENTAL SURGERY      Medications: Current Outpatient Medications  Medication Sig Dispense Refill  . cholecalciferol (VITAMIN D) 1000 units tablet Take 1,000 Units by mouth daily.    . naproxen sodium (ALEVE) 220 MG tablet Take 220 mg by mouth.    . Norgestimate-Ethinyl Estradiol Triphasic (TRI-SPRINTEC)  0.18/0.215/0.25 MG-35 MCG tablet Take 1 tablet by mouth daily.    . propranolol (INDERAL) 10 MG tablet Take 1-2 tablets (10-20 mg total) by mouth 2 (two) times daily as needed. 60 tablet 0  . sertraline (ZOLOFT) 100 MG tablet Take 1.5 tablets (150 mg total) by mouth daily. 45 tablet 1  . vitamin B-12 (CYANOCOBALAMIN) 100 MCG tablet Take 100 mcg by mouth daily.     No current facility-administered medications for this visit.     Allergies  Allergen Reactions  . Venlafaxine Nausea Only    Sweats    Diagnoses:    ICD-10-CM   1. Generalized anxiety disorder F41.1      Part II to be continued at next session:   yes   Anson [place], [facility]

## 2018-08-14 ENCOUNTER — Ambulatory Visit: Payer: BLUE CROSS/BLUE SHIELD | Admitting: Mental Health

## 2018-08-19 ENCOUNTER — Encounter: Payer: Self-pay | Admitting: Emergency Medicine

## 2018-08-19 DIAGNOSIS — F39 Unspecified mood [affective] disorder: Secondary | ICD-10-CM | POA: Insufficient documentation

## 2018-08-20 ENCOUNTER — Ambulatory Visit (INDEPENDENT_AMBULATORY_CARE_PROVIDER_SITE_OTHER): Payer: BLUE CROSS/BLUE SHIELD | Admitting: Mental Health

## 2018-08-20 ENCOUNTER — Ambulatory Visit: Payer: BLUE CROSS/BLUE SHIELD | Admitting: Mental Health

## 2018-08-20 DIAGNOSIS — F411 Generalized anxiety disorder: Secondary | ICD-10-CM | POA: Diagnosis not present

## 2018-08-20 NOTE — Progress Notes (Signed)
     Abuse History: Victim: none Report needed: no Perpetrator of abuse: no Witness / Exposure to Domestic Violence:  none Protective Services Involvement: no Witness to [employer] Violence:  no   Family / Social History:    Living situation: lives w/ 3 roommates at college; at home - pt, mother - Insurance account managerenee, father- Medical sales representativemichael and brother - Will- age 22 Sexual Orientation:  Relationship Status:   none Name of spouse / other: none If a parent, number of children / ages:   none  Support Systems: parents, friends  Surveyor, quantityinancial Stress:  none  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

## 2018-08-20 NOTE — Progress Notes (Signed)
Psychotherapy Note  Name: Jade Mcintosh Date: 08/20/18 MRN: [id number] DOB: [birth year] PCP: Shirline Frees, MD  Time spent: 53 minutes  Treatment: Family therapy  Mental Status Exam:   Appearance:   Casual     Behavior:  Appropriate  Motor:  Normal  Speech/Language:   Clear and Coherent  Affect:  anxious  Mood:  congruent  Thought process:  normal  Thought content:    WNL  Sensory/Perceptual disturbances:    WNL  Orientation:  oriented to person, place, time/date and situation  Attention:  NA  Concentration:  Good  Memory:  WNL  Fund of knowledge:   Good  Insight:    Good  Judgment:   Good  Impulse Control:  Good   Reported Symptoms:  Anxiety, negative self talk, shaming self, depressed mood at times, rumination  Risk Assessment: Danger to Self:  No Self-injurious Behavior: No Danger to Others: No Duty to Warn:no Physical Aggression / Violence:No  Access to Firearms a concern: No  Gang Involvement:No  Patient / guardian was educated about steps to take if suicide or homicide risk level increases between visits: yes While future psychiatric events cannot be accurately predicted, the patient does not currently require acute inpatient psychiatric care and does not currently meet [facility] involuntary commitment criteria.  Subjective: Met with patient, husband and accompanied patient per her request and to session.  Discussed progress with communication.  Husband continues to struggle with trust in the relationship due to his wife's history of infidelity.  He stated that they have been getting along better over the past week or 2, sharing some experiences.  She shared how she continues to notice that he struggles to trust her, has checking behaviors giving some examples.  They stated they have struggled to have "check-in's" at least weekly to talk through issues about the relationship and about how they were doing individually.  Both agreed to go for following through  in this process.  Assisted them in communication throughout the session.  Medical History/Surgical History:reviewed Past Medical History:  Diagnosis Date  . Anorexia nervosa   . Anxiety     Past Surgical History:  Procedure Laterality Date  . BONE MARROW BIOPSY    . DENTAL SURGERY      Medications: Current Outpatient Medications  Medication Sig Dispense Refill  . carbamazepine (EQUETRO) 200 MG CP12 12 hr capsule Take 200 mg by mouth at bedtime.    . cholecalciferol (VITAMIN D) 1000 units tablet Take 1,000 Units by mouth daily.    Marland Kitchen lithium carbonate 300 MG capsule Take 300 mg by mouth. 1/d x 1wk, then 2/d    . naproxen sodium (ALEVE) 220 MG tablet Take 220 mg by mouth.    . Norgestimate-Ethinyl Estradiol Triphasic (TRI-SPRINTEC) 0.18/0.215/0.25 MG-35 MCG tablet Take 1 tablet by mouth daily.    . propranolol (INDERAL) 10 MG tablet Take 1-2 tablets (10-20 mg total) by mouth 2 (two) times daily as needed. 60 tablet 0  . sertraline (ZOLOFT) 100 MG tablet Take 1.5 tablets (150 mg total) by mouth daily. 45 tablet 1  . vitamin B-12 (CYANOCOBALAMIN) 100 MCG tablet Take 100 mcg by mouth daily.     No current facility-administered medications for this visit.     Allergies  Allergen Reactions  . Venlafaxine Nausea Only    Sweats    Diagnoses:    ICD-10-CM   1. Generalized anxiety disorder F41.1      Plan:  1.  Patient to continue to engage in  individual counseling 2-4 times a month or as needed. 2.  Patient to identify and apply CBT, coping skills learned in session to decrease depression and anxiety symptoms. 3.  Patient and husband to improve communication to strengthen their relationship. 3.  Patient to contact this office, go to the local ED or call 911 if a crisis or emergency develops between visits.   Anson [place], [facility]

## 2018-08-31 ENCOUNTER — Ambulatory Visit: Payer: BLUE CROSS/BLUE SHIELD | Admitting: Psychiatry

## 2018-09-07 ENCOUNTER — Ambulatory Visit (INDEPENDENT_AMBULATORY_CARE_PROVIDER_SITE_OTHER): Payer: BLUE CROSS/BLUE SHIELD | Admitting: Physician Assistant

## 2018-09-07 ENCOUNTER — Encounter: Payer: Self-pay | Admitting: Physician Assistant

## 2018-09-07 VITALS — BP 125/83 | HR 90

## 2018-09-07 DIAGNOSIS — F411 Generalized anxiety disorder: Secondary | ICD-10-CM

## 2018-09-07 DIAGNOSIS — F329 Major depressive disorder, single episode, unspecified: Secondary | ICD-10-CM

## 2018-09-07 DIAGNOSIS — F32A Depression, unspecified: Secondary | ICD-10-CM

## 2018-09-07 MED ORDER — SERTRALINE HCL 100 MG PO TABS: 150.0000 mg | ORAL_TABLET | Freq: Every day | ORAL | 2 refills | Status: DC

## 2018-09-07 MED ORDER — PROPRANOLOL HCL 10 MG PO TABS: 10.0000 mg | ORAL_TABLET | Freq: Two times a day (BID) | ORAL | 2 refills | Status: DC | PRN

## 2018-09-07 NOTE — Progress Notes (Signed)
[facility] Med Check  Patient ID: Jade Mcintosh,  MRN: [id number]  PCP: Johny Blamer, MD  Date of Evaluation: 09/07/2018 Time spent:15 minutes  Chief Complaint:  Chief Complaint    Follow-up      HISTORY/CURRENT STATUS: HPI here for routine med check after starting propranolol.  Patient states she is doing very well.  Propranolol has really helped with the anxiety.  For a few days, she took it routinely, during exam time at school.  It really helped a lot.  Now she is only using it as needed.  She did notice some mild dizziness and seeing spots in front of her eyes on a rare occasion when she stood up quickly.  Dates that was going on prior to taking the propranolol though.  Panic attacks are rare now and are not as severe.  She is also doing well with the increase in Zoloft.  Patient denies loss of interest in usual activities and is able to enjoy things.  Denies decreased energy or motivation.  Appetite has not changed.  No extreme sadness, tearfulness, or feelings of hopelessness.  Denies any changes in concentration, making decisions or remembering things.  Denies suicidal or homicidal thoughts.  In early March, she is having a "nose job."  States her Careers adviser is requesting a note from me stating that mentally it is fine for her to have the surgery.  I think she is stable and certainly able to make that decision.  I will send a note to that effect.  She has started her new semester at [employer] state.  Things are going well there.  Individual Medical History/ Review of Systems: Changes? :No   Allergies: Venlafaxine  Current Medications:  Current Outpatient Medications:  .  cholecalciferol (VITAMIN D) 1000 units tablet, Take 1,000 Units by mouth daily., Disp: , Rfl:  .  naproxen sodium (ALEVE) 220 MG tablet, Take 220 mg by mouth., Disp: , Rfl:  .  Norgestimate-Ethinyl Estradiol Triphasic (TRI-SPRINTEC) 0.18/0.215/0.25 MG-35 MCG tablet, Take 1 tablet by mouth daily., Disp: ,  Rfl:  .  propranolol (INDERAL) 10 MG tablet, Take 1-2 tablets (10-20 mg total) by mouth 2 (two) times daily as needed., Disp: 60 tablet, Rfl: 2 .  sertraline (ZOLOFT) 100 MG tablet, Take 1.5 tablets (150 mg total) by mouth daily., Disp: 45 tablet, Rfl: 2 .  vitamin B-12 (CYANOCOBALAMIN) 100 MCG tablet, Take 100 mcg by mouth daily., Disp: , Rfl:  Medication Side Effects: none  Family Medical/ Social History: Changes? No  MENTAL HEALTH EXAM:  Blood pressure 125/83, pulse 90.There is no height or weight on file to calculate BMI.  General Appearance: Casual and Well Groomed  Eye Contact:  Good  Speech:  Clear and Coherent  Volume:  Normal  Mood:  Euthymic  Affect:  Appropriate  Thought Process:  Goal Directed  Orientation:  Full (Time, Place, and Person)  Thought Content: Logical   Suicidal Thoughts:  No  Homicidal Thoughts:  No  Memory:  WNL  Judgement:  Good  Insight:  Good  Psychomotor Activity:  Normal  Concentration:  Concentration: Good  Recall:  Good  Fund of Knowledge: Good  Language: Good  Assets:  Desire for Improvement  ADL's:  Intact  Cognition: WNL  Prognosis:  Good    DIAGNOSES:    ICD-10-CM   1. Generalized anxiety disorder F41.1   2. Depression, unspecified depression type F32.9     Receiving Psychotherapy: Yes Elio Forget, [facility]   RECOMMENDATIONS: I am glad she is  doing so well! Continue Zoloft 150 mg p.o. every morning. Continue propranolol 10 to 20 mg twice daily as needed.  Watch for orthostatic symptoms and we discussed the fact that she needs to sit up for a few seconds and then stand up for a few seconds before walking across the room in order to prevent she verbalizes understanding.  Another thing she should take only 1 of the propranolol and not 2 at a time right off the bat.  10 mg may be enough to help with the panic and not cause dizziness.  She understands. I will send a note to her surgeon Dr. Rosine Beat at fax number [phone], stating  that mentally she is okay to have plastic surgery. Continue psychotherapy with Elio Forget, [facility] or someone at [facility] state. Return in 3 months.  Melony Overly, PA-C

## 2018-09-11 ENCOUNTER — Encounter: Payer: Self-pay | Admitting: Physician Assistant

## 2018-09-13 ENCOUNTER — Telehealth: Payer: Self-pay | Admitting: Physician Assistant

## 2018-09-14 NOTE — Telephone Encounter (Signed)
Spoke w/ Dr. Neale Burly, plastic surgeon, about patient's desire for cosmetic nasal surgery.  He's concerned about BDS.  Will fax me questionaire he had her do and would like for me to assess further. I'll have her come in to discuss.

## 2018-09-24 ENCOUNTER — Telehealth: Payer: Self-pay | Admitting: Physician Assistant

## 2018-09-24 NOTE — Telephone Encounter (Signed)
Dr. Neale BurlyFreeman office callled and said that they are waiting for you to call them back about Jade Mcintosh. Her surgery is scheduled and needs your feedback. Please call the office at [phone].

## 2018-09-26 NOTE — Telephone Encounter (Signed)
Maralyn Sago, can you call the pt and get her in to see me at the earliest possible appt.  Then let Dr. Onnie Boer office know when the appt is.  I can't make a determination of her dx without seeing her again. Thanks

## 2018-10-03 ENCOUNTER — Encounter: Payer: Self-pay | Admitting: Physician Assistant

## 2018-10-03 ENCOUNTER — Telehealth: Payer: Self-pay | Admitting: Physician Assistant

## 2018-10-03 NOTE — Telephone Encounter (Signed)
The form is in your box for review

## 2018-10-03 NOTE — Telephone Encounter (Signed)
Traci please call Dr. 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 Neale Burly about this.)  Due to scheduling, I doubt I'll be able to get her in before the surgery is scheduled in early March (what the pt told me).  At this point, since the pt has never presented to me problems indicating body dysmorphia, I say go ahead with the surgery.  I don't have her chart but don't remember a form I was supposed to complete, only a questionair that he had pt do to r/o BDS.  Does he need just a note on Rx saying I okay her surgery or what?

## 2018-10-03 NOTE — Telephone Encounter (Signed)
Spoke with Jade Mcintosh and they will refax form, it was sent on 09/14/2018

## 2018-10-03 NOTE — Telephone Encounter (Signed)
I'm sending letter to Dr. Neale Burly.

## 2018-10-03 NOTE — Telephone Encounter (Signed)
Carney Bern from Dr. Sheralyn Boatman office left a vm stating the patient is having surgey did not say when, patient needs clearance form for clearance was faxed, Carney Bern did not specify when he form was sent.  Please contact Dr. Onnie Boer office at [phone]

## 2018-10-05 ENCOUNTER — Telehealth: Payer: Self-pay | Admitting: Physician Assistant

## 2018-10-05 NOTE — Telephone Encounter (Signed)
Mom Renee called. She and Twinkle are very upset concerning a letter that was sent to the surgeon. On her 1/10 visit the impression that was given  you were on board for her pending surgery That was precisely what the visit was for.Schedules were manipulated to accommodate the procedure. Please called ASAP when you are back in the office. Tentatively an appointment has been made, but this is a 3hr trip that seems to be a repeat of 1/10 I hope she doesn't need to take this trip. Please call Renee ASAP  [phone]

## 2018-10-08 NOTE — Telephone Encounter (Signed)
Please review

## 2018-10-15 ENCOUNTER — Ambulatory Visit: Payer: BLUE CROSS/BLUE SHIELD | Admitting: Physician Assistant

## 2018-11-02 ENCOUNTER — Ambulatory Visit: Payer: BLUE CROSS/BLUE SHIELD | Admitting: Psychiatry

## 2018-11-09 ENCOUNTER — Other Ambulatory Visit: Payer: Self-pay

## 2018-11-09 ENCOUNTER — Encounter: Payer: Self-pay | Admitting: Psychiatry

## 2018-11-09 ENCOUNTER — Ambulatory Visit (INDEPENDENT_AMBULATORY_CARE_PROVIDER_SITE_OTHER): Payer: BLUE CROSS/BLUE SHIELD | Admitting: Psychiatry

## 2018-11-09 DIAGNOSIS — F411 Generalized anxiety disorder: Secondary | ICD-10-CM | POA: Diagnosis not present

## 2018-11-09 NOTE — Progress Notes (Signed)
      [facility] Counselor/Therapist Progress Note  Patient ID: Jade Mcintosh, MRN: [id number],    Date: 11/09/2018  Time Spent: 51 minutes  Treatment Type: Individual Therapy  Reported Symptoms: anxiety, sleep issues, eating issues, body issues , obsessive thinking  Mental Status Exam:  Appearance:   Well Groomed     Behavior:  Appropriate  Motor:  Restlestness  Speech/Language:   Normal Rate  Affect:  Appropriate  Mood:  anxious  Thought process:  normal  Thought content:    Obsessions  Sensory/Perceptual disturbances:    WNL  Orientation:  oriented to person, place, time/date and situation  Attention:  Fair  Concentration:  Fair  Memory:  WNL  Fund of knowledge:   Fair  Insight:    Fair  Judgment:   Fair  Impulse Control:  Fair   Risk Assessment: Danger to Self:  No Self-injurious Behavior: No Danger to Others: No Duty to Warn:no Physical Aggression / Violence:No  Access to Firearms a concern: No  Gang Involvement:No   Subjective: Patient was present for session.  She reported she is coming to treatment due to anxiety.  She shared that she is currently at [employer] and she has had a hard time making friends there.  She shared she has friends at [facility] and goes there on the weekends or home because she just doesn't want to be there.  She shared that the last few years have been very difficult.  She is currently in a bad situation with roommates.  Pt shared she has had therapy off and on for years.  Pt stated her senior year in high school dad lost his job, brother went to [employer] and things fell apart- tried to hurt himself and it was very bad.  Brother is back in school and still using and there are still on going problems.  Developed treatment plan in session after gathering some of the background information from patient.  It was agreed that the anxiety would be focused on in treatment at this time but the eating issues will be monitored.  Patient was encouraged  to start focusing on just taking deep breaths and recognizing her automatic negative thoughts.  More coping skills and CBT will be started with patient at next session  Interventions: Solution-Oriented/Positive Psychology  Diagnosis:   ICD-10-CM   1. Generalized anxiety disorder F41.1     Plan: 1.  Patient to continue to engage in individual counseling 2-4 times a month or as needed. 2.  Patient to identify and apply CBT, coping skills learned in session to decrease anxiety symptoms. 3.  Patient to contact this office, go to the local ED or call 911 if a crisis or emergency develops between visits.  Stevphen Meuse, [place]

## 2018-11-30 ENCOUNTER — Ambulatory Visit: Payer: BLUE CROSS/BLUE SHIELD | Admitting: Physician Assistant

## 2018-11-30 ENCOUNTER — Ambulatory Visit: Payer: BLUE CROSS/BLUE SHIELD | Admitting: Psychiatry

## 2018-12-14 ENCOUNTER — Ambulatory Visit (INDEPENDENT_AMBULATORY_CARE_PROVIDER_SITE_OTHER): Payer: BLUE CROSS/BLUE SHIELD | Admitting: Psychiatry

## 2018-12-14 ENCOUNTER — Other Ambulatory Visit: Payer: Self-pay

## 2018-12-14 DIAGNOSIS — F411 Generalized anxiety disorder: Secondary | ICD-10-CM

## 2018-12-14 NOTE — Progress Notes (Signed)
[facility] Counselor/Therapist Progress Note  Patient ID: Jade Mcintosh, MRN: [id number],    Date: 12/16/2018  Time Spent: 49 minutes  Start time 3:02 PM End time 3:51 PM  Virtual Visit via Telephone Note I connected with patient by a video enabled telemedicine application or telephone, with their informed consent, and verified patient privacy and that I am speaking with the correct person using two identifiers. I discussed the limitations, risks, security and privacy concerns of performing psychotherapy and management service by telephone and the availability of in person appointments. I also discussed with the patient that there may be a patient responsible charge related to this service. The patient expressed understanding and agreed to proceed. I discussed the treatment planning with the patient. The patient was provided an opportunity to ask questions and all were answered. The patient agreed with the plan and demonstrated an understanding of the instructions. The patient was advised to call  our office if  symptoms worsen or feel they are in a crisis state and need immediate contact. Patient was at home and clinician was at her home Treatment Type: Individual Therapy  Reported Symptoms: anxiety, irritability  Mental Status Exam:  Appearance:   NA     Behavior:  Sharing  Motor:  NA  Speech/Language:   Normal Rate  Affect:  NA  Mood:  normal  Thought process:  normal  Thought content:    WNL  Sensory/Perceptual disturbances:    WNL  Orientation:  oriented to person, place, time/date and situation  Attention:  Good  Concentration:  Good  Memory:  WNL  Fund of knowledge:   Good  Insight:    Fair  Judgment:   Good  Impulse Control:  Good   Risk Assessment: Danger to Self:  No Self-injurious Behavior: No Danger to Others: No Duty to Warn:no Physical Aggression / Violence:No  Access to Firearms a concern: No  Gang Involvement:No   Subjective: Met with patient  via phone.  Patient reported she had gotten into [facility] [employer] [facility] and that had reduce some of her anxiety.  She went on to explain that now she is trying to figure out what to do concerning her living situation for the fall and what to do about courses for the summer with the situation.  Discussed different options with patient and tried to help her think through some possibilities.  And ways to find information to help her make good decisions.  Patient went on to share the biggest stressor currently is that her brother had gotten another drug driving charge.  She shared that her parents are both overwhelmed and stressed and are not sure what to do.  She explained that they are still fearful of what happened last year when he threatened to kill himself and ended on the inpatient unit.  She went on to share her emotions and how the whole situation impacts her relationship with her parents and the level of stress in the house.  Different strategies to help her disconnect from the situation when she needs to were discussed with patient.  The importance of focusing on her thoughts and staying focused on the truth was addressed as well.  Patient was also taught the grounded and 5 exercise and encouraged to do the visualization of going to her safe place as needed.  Patient agreed to try and practice tools discussed in session to see if that helps continue to try to reduce her anxiety.  Interventions: Cognitive Behavioral Therapy  and Solution-Oriented/Positive Psychology  Diagnosis:   ICD-10-CM   1. Generalized anxiety disorder F41.1     Plan: 1.  Patient to continue to engage in individual counseling 2-4 times a month or as needed. 2.  Patient to identify and apply CBT, coping skills learned in session to decrease anxiety symptoms. 3.  Patient to contact this office, go to the local ED or call 911 if a crisis or emergency develops between visits.  Lina Sayre, [facility]   This record has been created using  [employer].  Chart creation errors have been sought, but may not always have been located and corrected. Such creation errors do not reflect on the standard of medical care.

## 2018-12-16 ENCOUNTER — Encounter: Payer: Self-pay | Admitting: Psychiatry

## 2018-12-24 ENCOUNTER — Other Ambulatory Visit: Payer: Self-pay | Admitting: Physician Assistant

## 2018-12-24 NOTE — Telephone Encounter (Signed)
Please refill.

## 2018-12-28 ENCOUNTER — Other Ambulatory Visit: Payer: Self-pay

## 2018-12-28 ENCOUNTER — Ambulatory Visit (INDEPENDENT_AMBULATORY_CARE_PROVIDER_SITE_OTHER): Payer: BLUE CROSS/BLUE SHIELD | Admitting: Psychiatry

## 2018-12-28 DIAGNOSIS — F411 Generalized anxiety disorder: Secondary | ICD-10-CM

## 2018-12-28 NOTE — Progress Notes (Signed)
[facility] Counselor/Therapist Progress Note  Patient ID: Jade Mcintosh, MRN: [id number],    Date: 12/30/2018  Time Spent: 50 minutes start time 12:05 PM end time 12:55 PM Virtual Visit via Telephone Note Connected with patient by a video enabled telemedicine/telehealth application or telephone, with their informed consent, and verified patient privacy and that I am speaking with the correct person using two identifiers. I discussed the limitations, risks, security and privacy concerns of performing psychotherapy and management service by telephone and the availability of in person appointments. I also discussed with the patient that there may be a patient responsible charge related to this service. The patient expressed understanding and agreed to proceed. I discussed the treatment planning with the patient. The patient was provided an opportunity to ask questions and all were answered. The patient agreed with the plan and demonstrated an understanding of the instructions. The patient was advised to call  our office if  symptoms worsen or feel they are in a crisis state and need immediate contact.  Therapist Location: home Patient Location: home   Treatment Type: Individual Therapy  Reported Symptoms: anxiety, eating issues  Mental Status Exam:  Appearance:   NA     Behavior:  Sharing  Motor:  NA  Speech/Language:   Normal Rate  Affect:  NA  Mood:  normal  Thought process:  normal  Thought content:    Rumination  Sensory/Perceptual disturbances:    WNL  Orientation:  oriented to person, place, time/date and situation  Attention:  Good  Concentration:  Good  Memory:  WNL  Fund of knowledge:   Good  Insight:    Fair  Judgment:   Good  Impulse Control:  Fair   Risk Assessment: Danger to Self:  No Self-injurious Behavior: No Danger to Others: No Duty to Warn:no Physical Aggression / Violence:No  Access to Firearms a concern: No  Gang Involvement:No   Subjective:  Met with patient via phone.  Patient reported that she was feeling very anxious at time of session.  She shared she was trying to figure out what to do in the fall concerning her school with so many things being up in the air with a coronavirus.  Had patient think through her different options and figure out what her body and her thoughts were telling her.  Patient had a very strong response when it came to returning to [employer] [facility] [employer] [facility], from that was able to realize that she did need to transfer to [employer] in the fall.  Discussed different ways to work out some of her concerns and how to be more proactive than reactive to different situations.  Patient reported feeling positive about plans developed in session and good about going to [employer].  Patient went on to explain she is recognizing that she is having some eating issues with the anxiety.  She is able to recognize that some of her thoughts about her body are not appropriate and she can get lots of rumination going on in her head.  Patient was encouraged to start focusing on just taking care of her brain and her immune system other than focusing on weight and body image.  Patient had reported being able to function cognitively and staying healthy were her biggest concerns so she was encouraged to ask herself what she needs to do each day to help with those 2 issues and to keep her thoughts focused on her immune system getting stronger as well as  her cognitions.  Patient was encouraged to do different CBT skills to help her on that journey.  Patient agreed to follow plans developed in session.  Interventions: Cognitive Behavioral Therapy and Solution-Oriented/Positive Psychology  Diagnosis:   ICD-10-CM   1. Generalized anxiety disorder F41.1     Plan: 1.  Patient to continue to engage in individual counseling 2-4 times a month or as needed. 2.  Patient to identify and apply CBT, coping skills learned in session to decrease anxiety symptoms. 3.   Patient to contact this office, go to the local ED or call 911 if a crisis or emergency develops between visits.  Lina Sayre, [facility]   This record has been created using [employer].  Chart creation errors have been sought, but may not always have been located and corrected. Such creation errors do not reflect on the standard of medical care.

## 2018-12-30 ENCOUNTER — Encounter: Payer: Self-pay | Admitting: Psychiatry

## 2019-01-11 ENCOUNTER — Other Ambulatory Visit: Payer: Self-pay

## 2019-01-11 ENCOUNTER — Ambulatory Visit (INDEPENDENT_AMBULATORY_CARE_PROVIDER_SITE_OTHER): Payer: BLUE CROSS/BLUE SHIELD | Admitting: Psychiatry

## 2019-01-11 DIAGNOSIS — F411 Generalized anxiety disorder: Secondary | ICD-10-CM | POA: Diagnosis not present

## 2019-01-11 NOTE — Progress Notes (Signed)
[facility] Counselor/Therapist Progress Note  Patient ID: Jade Mcintosh, MRN: [id number],    Date: 01/11/2019  Time Spent: 48 minutes start time 3:00PM end time 3:48 PM Virtual Visit via Telephone Note Connected with patient by a video enabled telemedicine/telehealth application or telephone, with their informed consent, and verified patient privacy and that I am speaking with the correct person using two identifiers. I discussed the limitations, risks, security and privacy concerns of performing psychotherapy and management service by telephone and the availability of in person appointments. I also discussed with the patient that there may be a patient responsible charge related to this service. The patient expressed understanding and agreed to proceed. I discussed the treatment planning with the patient. The patient was provided an opportunity to ask questions and all were answered. The patient agreed with the plan and demonstrated an understanding of the instructions. The patient was advised to call  our office if  symptoms worsen or feel they are in a crisis state and need immediate contact.   Therapist Location: home Patient Location: home    Treatment Type: Individual Therapy  Reported Symptoms: Anxiety, body image issues  Mental Status Exam:  Appearance:   Casual     Behavior:  Sharing  Motor:  Normal  Speech/Language:   Normal Rate  Affect:  Appropriate  Mood:  normal  Thought process:  normal  Thought content:    WNL  Sensory/Perceptual disturbances:    WNL  Orientation:  oriented to person, place, time/date and situation  Attention:  Good  Concentration:  Good  Memory:  WNL  Fund of knowledge:   Good  Insight:    Good  Judgment:   Good  Impulse Control:  Good   Risk Assessment: Danger to Self:  No Self-injurious Behavior: No Danger to Others: No Duty to Warn:no Physical Aggression / Violence:No  Access to Firearms a concern: No  Gang Involvement:No    Subjective: Met with patient via virtual session through WebEx.  Patient reported that overall she was seeming to do okay.  She had signed up for her classes at [employer] her new school and that was a positive thing for her.  Patient went on to explain she continues to have issues with her body.  She recognizes that she is obsessive about exercise and diet and yet she still feels overweight.  Patient reported wanting to explore what was behind the issue she was having.  Patient was encouraged to think through messages she got concerning her body from as far back as she could remember.  Her first memory was when she was in fourth grade and she started going to a different public school.  She shared that they had lists of people that were hot and people that were not.  She reported that she was put on the not list.  Patient explained that she has felt ugly since that time.  Patient also shared that she is never been in a dating relationship and is very awkward and anxious around the opposite sex for that reason.  Started an E MDR set on that picture of the not list.  She reported her suds level was 4.  She shared her negative cognition was I am ugly and she felt lots of her and shame in her body.  Patient started the processing but was not able to complete it due to difficulty with the connection with the WebEx.  Patient was encouraged to start using some CBT skills and  reminding that younger part of her that that is not the truth and letting her know that she is turned into a beautiful young woman and that she does have friends and that she is enough.  Patient was encouraged to write her letter even though that felt very funny letting her know the truth about who she is as a person.  Interventions: Cognitive Behavioral Therapy and Solution-Oriented/Positive PsychologyEMDR  Diagnosis:   ICD-10-CM   1. Generalized anxiety disorder F41.1     Plan: 1.  Patient to continue to engage in individual counseling  2-4 times a month or as needed. 2.  Patient to identify and apply CBT, coping skills learned in session to decrease  anxiety symptoms. 3.  Patient to contact this office, go to the local ED or call 911 if a crisis or emergency develops between visits.  Lina Sayre, [facility]   This record has been created using [employer].  Chart creation errors have been sought, but may not always have been located and corrected. Such creation errors do not reflect on the standard of medical care.

## 2019-01-13 ENCOUNTER — Encounter: Payer: Self-pay | Admitting: Psychiatry

## 2019-01-25 ENCOUNTER — Ambulatory Visit (INDEPENDENT_AMBULATORY_CARE_PROVIDER_SITE_OTHER): Payer: BLUE CROSS/BLUE SHIELD | Admitting: Psychiatry

## 2019-01-25 ENCOUNTER — Other Ambulatory Visit: Payer: Self-pay

## 2019-01-25 DIAGNOSIS — F4522 Body dysmorphic disorder: Secondary | ICD-10-CM

## 2019-01-25 DIAGNOSIS — F411 Generalized anxiety disorder: Secondary | ICD-10-CM | POA: Diagnosis not present

## 2019-01-25 NOTE — Progress Notes (Signed)
      [facility] Counselor/Therapist Progress Note  Patient ID: Jade Mcintosh, MRN: [id number],    Date: 01/26/2019  Time Spent: 46 minutes start time 2:58 PM and time 3:44 PM  Treatment Type: Individual Therapy  Reported Symptoms: Anxiety, body image issues  Mental Status Exam:  Appearance:   Casual     Behavior:  Sharing  Motor:  Normal  Speech/Language:   Normal Rate  Affect:  Appropriate  Mood:  normal  Thought process:  normal  Thought content:    WNL  Sensory/Perceptual disturbances:    WNL  Orientation:  oriented to person, place, time/date and situation  Attention:  Good  Concentration:  Good  Memory:  WNL  Fund of knowledge:   Good  Insight:    Good  Judgment:   Good  Impulse Control:  Good   Risk Assessment: Danger to Self:  No Self-injurious Behavior: No Danger to Others: No Duty to Warn:no Physical Aggression / Violence:No  Access to Firearms a concern: No  Gang Involvement:No   Subjective: With patient via virtual session through WebEx.  Patient reported that she was feeling overwhelmed with her classes but excited at the same time.  Discussed different CBT skills to help her deal with the anxiety and encouraged her to recognize the truth that she got in there because she is smart and intelligent.  Patient reported still being very concerned about her body image issues and recognizing that they are off.  Patient did E MDR set on the picture of herself, level of disturbance was a 7, negative believe "I am not attractive" felt shame sadness and anxiety.  Patient had difficulty shifting her believes and lowering level of disturbance.  A visual was developed in session of a patch chart to help her start recognizing her appearance is only 1 part of her and she has many wonderful qualities that she needs to focus on more than anything at this time.  Patient was encouraged to recognize she still is very pretty and her weight is appropriate for her height.  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  Patient was encouraged to start telling herself the truth and agreed to continue working on the processing in office with hopes that that will go further.  Interventions: Cognitive Behavioral Therapy and Eye Movement Desensitization and Reprocessing (EMDR)  Diagnosis:   ICD-10-CM   1. Generalized anxiety disorder F41.1   2. 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  Patient to continue to engage in individual counseling 2-4 times a month or as needed. 2.  Patient to identify and apply CBT, coping skills learned in session to decrease distorted body image and anxiety symptoms. 3.  Patient to contact this office, go to the local ED or call 911 if a crisis or emergency develops between visits.  Stevphen Meuse, [facility]   This record has been created using [employer].  Chart creation errors have been sought, but may not always have been located and corrected. Such creation errors do not reflect on the standard of medical care.

## 2019-01-26 ENCOUNTER — Encounter: Payer: Self-pay | Admitting: Psychiatry

## 2019-01-27 ENCOUNTER — Other Ambulatory Visit: Payer: Self-pay | Admitting: Physician Assistant

## 2019-02-25 ENCOUNTER — Other Ambulatory Visit: Payer: Self-pay | Admitting: Physician Assistant

## 2019-02-26 ENCOUNTER — Telehealth: Payer: Self-pay | Admitting: Physician Assistant

## 2019-02-26 ENCOUNTER — Other Ambulatory Visit: Payer: Self-pay

## 2019-02-26 MED ORDER — SERTRALINE HCL 100 MG PO TABS: ORAL_TABLET | ORAL | 0 refills | Status: DC

## 2019-02-26 NOTE — Telephone Encounter (Signed)
Pt called to request refill on Sertraline at [place]. Also scheduled appt for 7/28

## 2019-02-26 NOTE — Telephone Encounter (Signed)
1 month refill submitted 

## 2019-03-26 ENCOUNTER — Encounter: Payer: Self-pay | Admitting: Physician Assistant

## 2019-03-26 ENCOUNTER — Ambulatory Visit (INDEPENDENT_AMBULATORY_CARE_PROVIDER_SITE_OTHER): Payer: BC Managed Care – PPO | Admitting: Physician Assistant

## 2019-03-26 DIAGNOSIS — F32A Depression, unspecified: Secondary | ICD-10-CM

## 2019-03-26 DIAGNOSIS — F411 Generalized anxiety disorder: Secondary | ICD-10-CM | POA: Diagnosis not present

## 2019-03-26 DIAGNOSIS — F329 Major depressive disorder, single episode, unspecified: Secondary | ICD-10-CM

## 2019-03-26 DIAGNOSIS — F4522 Body dysmorphic disorder: Secondary | ICD-10-CM | POA: Diagnosis not present

## 2019-03-26 NOTE — Progress Notes (Signed)
[facility] Med Check  Patient ID: Jade Mcintosh,  MRN: [id number]  PCP: Johny BlamerHarris, William, MD  Date of Evaluation: 03/26/2019 Time spent:15 minutes  Chief Complaint:  Chief Complaint    Follow-up     Virtual Visit via Telephone Note  I connected with patient by a video enabled telemedicine application or telephone, with their informed consent, and verified patient privacy and that I am speaking with the correct person using two identifiers.  I am private, in my home and the patient is home.  I discussed the limitations, risks, security and privacy concerns of performing an evaluation and management service by telephone and the availability of in person appointments. I also discussed with the patient that there may be a patient responsible charge related to this service. The patient expressed understanding and agreed to proceed.   I discussed the assessment and treatment plan with the patient. The patient was provided an opportunity to ask questions and all were answered. The patient agreed with the plan and demonstrated an understanding of the instructions.   The patient was advised to call back or seek an in-person evaluation if the symptoms worsen or if the condition fails to improve as anticipated.  I provided 15 minutes of non-face-to-face time during this encounter.  HISTORY/CURRENT STATUS: HPI for 2240-month med check.  Since her last visit, she has weaned off Zoloft.  She wanted to see how she feels without it.  She gradually weaned off over 3 months.  Ending a few weeks ago.  She has felt fine and has had no withdrawals since coming off of it.  She is also off the propranolol.  States her mood is good.  She denies anhedonia, motivation and energy are good, appetite is normal.  She reports no unintentional weight loss or gain.  No isolating.  Denies suicidal or homicidal thoughts.  Denies anxiety unless triggered and even then she is able to cope well with it.  Denies  dizziness, syncope, seizures, numbness, tingling, tremor, tics, unsteady gait, slurred speech, confusion. Denies muscle or joint pain, stiffness, or dystonia.  Individual Medical History/ Review of Systems: Changes? :Yes Had rhinoplasty in March and is very happy with it.  Past medications for mental health diagnoses include: unknown  Allergies: Venlafaxine  Current Medications:  Current Outpatient Medications:  .  cholecalciferol (VITAMIN D) 1000 units tablet, Take 1,000 Units by mouth daily., Disp: , Rfl:  .  naproxen sodium (ALEVE) 220 MG tablet, Take 220 mg by mouth., Disp: , Rfl:  .  Norgestimate-Ethinyl Estradiol Triphasic (TRI-SPRINTEC) 0.18/0.215/0.25 MG-35 MCG tablet, Take 1 tablet by mouth daily., Disp: , Rfl:  .  vitamin B-12 (CYANOCOBALAMIN) 100 MCG tablet, Take 100 mcg by mouth daily., Disp: , Rfl:  .  propranolol (INDERAL) 10 MG tablet, Take 1-2 tablets (10-20 mg total) by mouth 2 (two) times daily as needed. (Patient not taking: Reported on 03/26/2019), Disp: 60 tablet, Rfl: 2 .  sertraline (ZOLOFT) 100 MG tablet, TAKE 1.5 TABLETS BY MOUTH DAILY (Patient not taking: Reported on 03/26/2019), Disp: 45 tablet, Rfl: 0 Medication Side Effects: none  Family Medical/ Social History: Changes? No  MENTAL HEALTH EXAM:  There were no vitals taken for this visit.There is no height or weight on file to calculate BMI.  General Appearance: unable to assess  Eye Contact:  unable to assess  Speech:  Clear and Coherent  Volume:  Normal  Mood:  Euthymic  Affect:  Unable to assess  Thought Process:  Goal Directed  Orientation:  Full (Time, Place, and Person)  Thought Content: Logical   Suicidal Thoughts:  No  Homicidal Thoughts:  No  Memory:  WNL  Judgement:  Good  Insight:  Good  Psychomotor Activity:  Unable to assess  Concentration:  Concentration: Good  Recall:  Good  Fund of Knowledge: Good  Language: Good  Assets:  Desire for Improvement  ADL's:  Intact  Cognition: WNL   Prognosis:  Good    DIAGNOSES:    ICD-10-CM   1. Depression, unspecified depression type  F32.9   2. Generalized anxiety disorder  F41.1   3. Body dysmorphic disorder  F45.22     Receiving Psychotherapy: Yes    RECOMMENDATIONS:  We discussed her discontinuing the medication.  She weaned down slowly which was good. She knows the symptoms to watch for concerning recurrence, and will let me know if they occur. Continue therapy. Return as needed   Donnal Moat, PA-C   This record has been created using [employer].  Chart creation errors have been sought, but may not always have been located and corrected. Such creation errors do not reflect on the standard of medical care.

## 2019-05-09 ENCOUNTER — Encounter: Payer: Self-pay | Admitting: Psychiatry

## 2019-05-09 ENCOUNTER — Ambulatory Visit (INDEPENDENT_AMBULATORY_CARE_PROVIDER_SITE_OTHER): Payer: BC Managed Care – PPO | Admitting: Psychiatry

## 2019-05-09 ENCOUNTER — Other Ambulatory Visit: Payer: Self-pay

## 2019-05-09 DIAGNOSIS — F411 Generalized anxiety disorder: Secondary | ICD-10-CM | POA: Diagnosis not present

## 2019-05-09 NOTE — Progress Notes (Signed)
[facility] Counselor/Therapist Progress Note  Patient ID: Jade Mcintosh, MRN: [id number],    Date: 05/09/2019  Time Spent: 50 minutes start time 11:05 AM end time 11:55 AM  Treatment Type: Individual Therapy  Reported Symptoms: anxiety, fatigue, sleep issues  Mental Status Exam:  Appearance:   Well Groomed     Behavior:  Sharing  Motor:  Normal  Speech/Language:   Normal Rate  Affect:  Appropriate  Mood:  anxious  Thought process:  normal  Thought content:    WNL  Sensory/Perceptual disturbances:    WNL  Orientation:  oriented to person, place, time/date and situation  Attention:  Good  Concentration:  Good  Memory:  WNL  Fund of knowledge:   Good  Insight:    Good  Judgment:   Good  Impulse Control:  Good   Risk Assessment: Danger to Self:  No Self-injurious Behavior: No Danger to Others: No Duty to Warn:no Physical Aggression / Violence:No  Access to Firearms a concern: No  Gang Involvement:No   Subjective: Patient was present for session.  She shared she had gone to school for a while and ended up getting COVID 19 and came back home.  Patient went on to explain that she ended up giving her mother COVID as well.  She shared she is all better now but that it was very difficult.  Patient went on to explain that she has realiz year ed to the situation that even though she wants to get into nutrition it is not reasonable for her due to the chemistry component.  She did however state that she is going to get a degree in psychology since she was already working towards a minor in that.  Discussed different options she has for graduate school.  She shared she is going to look into different programs and apply to several different graduate schools next year.  Patient stated that she is happier with her new roommates and that she has stopped taking her medication completely.  She went on to explain she is having issues with her body image and that is creating anxiety for  her.  She explained that her roommates are very small and she compares herself to them often.  Discussed the importance of recognizing she is not her roommates and she has to take care of her body the way it was created and not do the things that her roommates do to their bodies.  Discussed different CBT filters to help her keep perspective on her eating and maintaining a healthy body image.  Also discussed the fact that she needs to release the stress from her body in a healthy manner since she is not sleeping well at night and does not have her typical level of exercise and movement in her life with the current situation.  Interventions: Cognitive Behavioral Therapy and Solution-Oriented/Positive Psychology  Diagnosis:   ICD-10-CM   1. Generalized anxiety disorder  F41.1     Plan: Patient is to utilize CBT skills to work on dealing with her body image issues that impact her anxiety.  She is also to work in more movement in her daily life to see if that can help with her sleep.  Long term goal: Resolve the core conflict that is the source of anxiety Short term goal: Identify the major life conflicts from the past and present that form the basis of present anxiety Verbalize an understanding of how thoughts feelings and behavioral actions contribute to anxiety and  its treatment Lina Sayre, [facility]

## 2019-05-21 ENCOUNTER — Other Ambulatory Visit: Payer: Self-pay | Admitting: Physician Assistant

## 2019-06-06 ENCOUNTER — Other Ambulatory Visit: Payer: Self-pay

## 2019-06-06 ENCOUNTER — Ambulatory Visit (INDEPENDENT_AMBULATORY_CARE_PROVIDER_SITE_OTHER): Payer: BC Managed Care – PPO | Admitting: Psychiatry

## 2019-06-06 DIAGNOSIS — F411 Generalized anxiety disorder: Secondary | ICD-10-CM

## 2019-06-06 NOTE — Progress Notes (Signed)
      [facility] Counselor/Therapist Progress Note  Patient ID: Jade Mcintosh, MRN: [id number],    Date: 06/06/2019  Time Spent: 50 minutes start time 3:02 PM end time 3:52 PM  Treatment Type: Individual Therapy  Reported Symptoms: anxiety,sadness, crying spells, restlessness  Mental Status Exam:  Appearance:   Well Groomed     Behavior:  Sharing  Motor:  Restlestness  Speech/Language:   Normal Rate  Affect:  Appropriate  Mood:  anxious  Thought process:  normal  Thought content:    WNL  Sensory/Perceptual disturbances:    WNL  Orientation:  oriented to person, place, time/date and situation  Attention:  Good  Concentration:  Good  Memory:  WNL  Fund of knowledge:   Good  Insight:    Good  Judgment:   Good  Impulse Control:  Good   Risk Assessment: Danger to Self:  No Self-injurious Behavior: No Danger to Others: No Duty to Warn:no Physical Aggression / Violence:No  Access to Firearms a concern: No  Gang Involvement:No   Subjective: Patient was present for session.  Patient more dead she is struggling with feeling very restless and overwhelmed and is not sure exactly where that is coming from.  She shared that her schoolwork is going well and things are positive with her friends.  She went on to explain she is trying to eat more because she realized she was not getting enough into her diet and she was concerned about her immune system.  She went on to share that the eating is increasing her stress.  Did E MDR set on eating more suds level 10, negative cognition "I am fat" felt anxiety in her head.  Patient was able to reduce level of disturbance to 5.  She shared that rationally she knows that it is a good thing for her to eat to take care of herself but she is still struggling with her parents.  Encouraged patient to start working on her affirmations and reminding herself that she is beautiful inside and out.  Patient was also encouraged to focus on her strengths and to  find ways to ask in them and feel positive about them.  Interventions: Solution-Oriented/Positive Psychology and Eye Movement Desensitization and Reprocessing (EMDR)  Diagnosis:   ICD-10-CM   1. Generalized anxiety disorder  F41.1     Plan: Patient is to utilize CBT and coping skills to try and decrease anxiety levels.  She is to write on a mirror that she is beautiful on the inside and outside.  She is also to try and focus on reminding herself of her strengths regularly.  She is to work and exercise on a regular basis to help decrease her anxiety as well. Long-term goal :resolve the core conflict that is the source of anxiety Short-term goal: Identify the major life complex from the past and present the form the basis for present anxiety Verbalize an understanding of how thoughts physical feelings and behavioral actions contribute to anxiety and its treatment  Jade Mcintosh, [facility]

## 2019-06-20 ENCOUNTER — Other Ambulatory Visit: Payer: Self-pay

## 2019-06-20 ENCOUNTER — Ambulatory Visit (INDEPENDENT_AMBULATORY_CARE_PROVIDER_SITE_OTHER): Payer: BC Managed Care – PPO | Admitting: Psychiatry

## 2019-06-20 DIAGNOSIS — F411 Generalized anxiety disorder: Secondary | ICD-10-CM

## 2019-06-20 NOTE — Progress Notes (Signed)
      [facility] Counselor/Therapist Progress Note  Patient ID: ARDICE BOYAN, MRN: [id number],    Date: 06/20/2019  Time Spent: 50 minutes start time 2:11PM end time 3:01 PM   Treatment Type: Individual Therapy  Reported Symptoms: anxiety, intrusive thoughts  Mental Status Exam:  Appearance:   Casual     Behavior:  Appropriate  Motor:  Restlestness  Speech/Language:   Normal Rate  Affect:  Appropriate  Mood:  anxious  Thought process:  normal  Thought content:    WNL  Sensory/Perceptual disturbances:    WNL  Orientation:  oriented to person, place, time/date and situation  Attention:  Good  Concentration:  Good  Memory:  WNL  Fund of knowledge:   Good  Insight:    Good  Judgment:   Good  Impulse Control:  Good   Risk Assessment: Danger to Self:  No Self-injurious Behavior: No Danger to Others: No Duty to Warn:no Physical Aggression / Violence:No  Access to Firearms a concern: No  Gang Involvement:No   Subjective: Patient was present for session.  Patient reported that she is continuing to have lots of anxiety over her eating.  Patient stated she is working on trying to utilize her CBT skills but still finding it very difficult.  She is realizing more more that she has very poor perception of her appearance.  Had patient do an E MDR set on looking in the mirror, suds level 10, negative cognition "ugly", felt anxiety all over.  Patient was able to reduce suds level to 7.  Through the processing she was able to recognize that she does not feel solid in her personality and that people are going to like her for her so she acknowledged that she puts a lot of pressure on herself to be beautiful so that people will like her.  Encouraged patient to start reminding her self she is enough just the way she is and to try and focus on letting the younger parts of her know that she is enough as well.  Patient was able to acknowledge that believes she has seem to have started in  elementary school but what started that automatic negative thoughts has not surfaced yet in treatment.  We will continue addressing this issue through EMDR and next session.  Interventions: Solution-Oriented/Positive Psychology and Eye Movement Desensitization and Reprocessing (EMDR)  Diagnosis:   ICD-10-CM   1. Generalized anxiety disorder  F41.1     Plan: Patient is to continue trying to utilize her coping skills to manage her anxiety appropriately.  She is also to start affirming herself regularly and letting herself know she is enough.  She is also to continue working on talking herself through eating and making sure she exercises on a regular basis. Long-term goal: Resolve the core conflict that is a source of anxiety Short-term goal: Identified the major life complex from the past and present that form the basis for present anxiety  Lina Sayre, [facility]

## 2019-06-21 ENCOUNTER — Encounter: Payer: Self-pay | Admitting: Psychiatry

## 2019-07-04 ENCOUNTER — Other Ambulatory Visit: Payer: Self-pay

## 2019-07-04 ENCOUNTER — Ambulatory Visit (INDEPENDENT_AMBULATORY_CARE_PROVIDER_SITE_OTHER): Payer: BC Managed Care – PPO | Admitting: Psychiatry

## 2019-07-04 DIAGNOSIS — F411 Generalized anxiety disorder: Secondary | ICD-10-CM | POA: Diagnosis not present

## 2019-07-04 NOTE — Progress Notes (Signed)
      [facility] Counselor/Therapist Progress Note  Patient ID: Jade Mcintosh, MRN: [id number],    Date: 07/04/2019  Time Spent: 50 minutes start time 2:08 PM end time 2:58 PM  Treatment Type: Individual Therapy  Reported Symptoms: anxiety, body image issues  Mental Status Exam:  Appearance:   Well Groomed     Behavior:  Appropriate  Motor:  Normal  Speech/Language:   Normal Rate  Affect:  Appropriate  Mood:  anxious  Thought process:  normal  Thought content:    WNL  Sensory/Perceptual disturbances:    WNL  Orientation:  oriented to person, place, time/date and situation  Attention:  Good  Concentration:  Good  Memory:  WNL  Fund of knowledge:   Good  Insight:    Good  Judgment:   Good  Impulse Control:  Good   Risk Assessment: Danger to Self:  No Self-injurious Behavior: No Danger to Others: No Duty to Warn:no Physical Aggression / Violence:No  Access to Firearms a concern: No  Gang Involvement:No   Subjective: Patient was present for session.  She explained her anxiety is decreasing overall.  She is having body image issues due to having to be more sedentary with  School work being overwhelming.  She shared that everything else is okay.  Patient shared that she is having anxiety over eating and realizes she needs to work on it.  Did EMDR set on eating dinner, suds level 10, negative cognition "I have failed", felt guilt in her head.  Patient was able to reduce suds level to 7.  She shared it is hard for her to realize that with not exercising she isn't eating too much.  Patient was encouraged to continue focusing on reminding herself to focus on making sure she eats foods that have the nutrients she needs for herself.  She is also to continue trying to exercise and work on movement throughout the day so she does not have to feel so guilty about eating.  Interventions: Solution-Oriented/Positive Psychology and Eye Movement Desensitization and Reprocessing  (EMDR)  Diagnosis:   ICD-10-CM   1. Generalized anxiety disorder  F41.1     Plan: Patient is to use CBT and coping skills to decrease anxiety and improve ability to eat appropriately.  Patient is to work on finding ways throughout the day to work in movement and to focus on eating foods that are good for her brain especially. Long-term goal: Resolve the core conflict that is the source of anxiety Short-term goal: Identify the major life complex from the past and present the form the basis for anxiety Lina Sayre, [facility]

## 2019-07-18 ENCOUNTER — Ambulatory Visit: Payer: BC Managed Care – PPO | Admitting: Psychiatry

## 2019-07-19 IMAGING — CR DG CHEST 2V
2 series · 2 of 2 positions shown · non-contrast
Comparison: Chest radiograph dated 12/29/2017

CLINICAL DATA: 21-year-old female with fever and cough.

EXAM:
CHEST - 2 VIEW

[w chest pa]
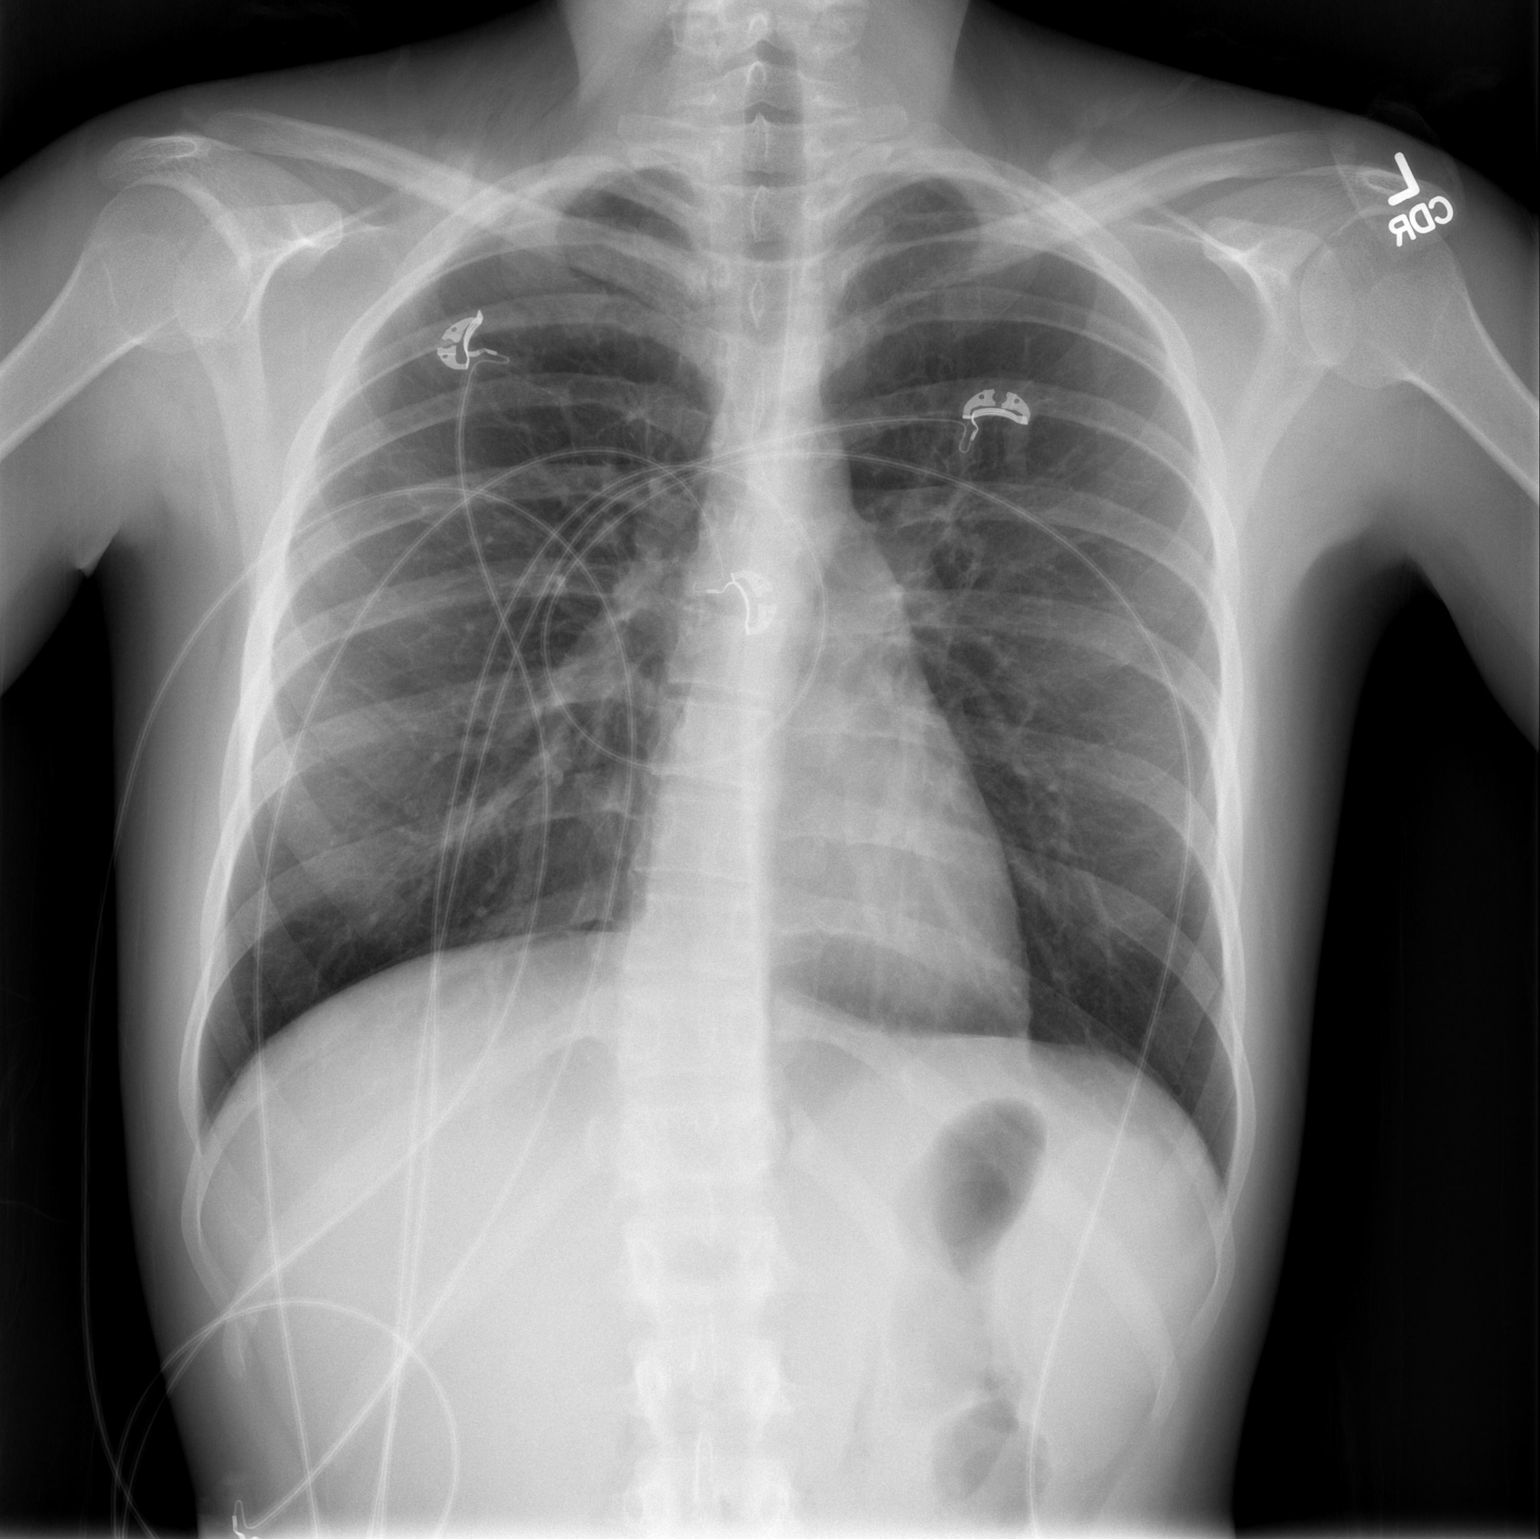

[w chest lat]
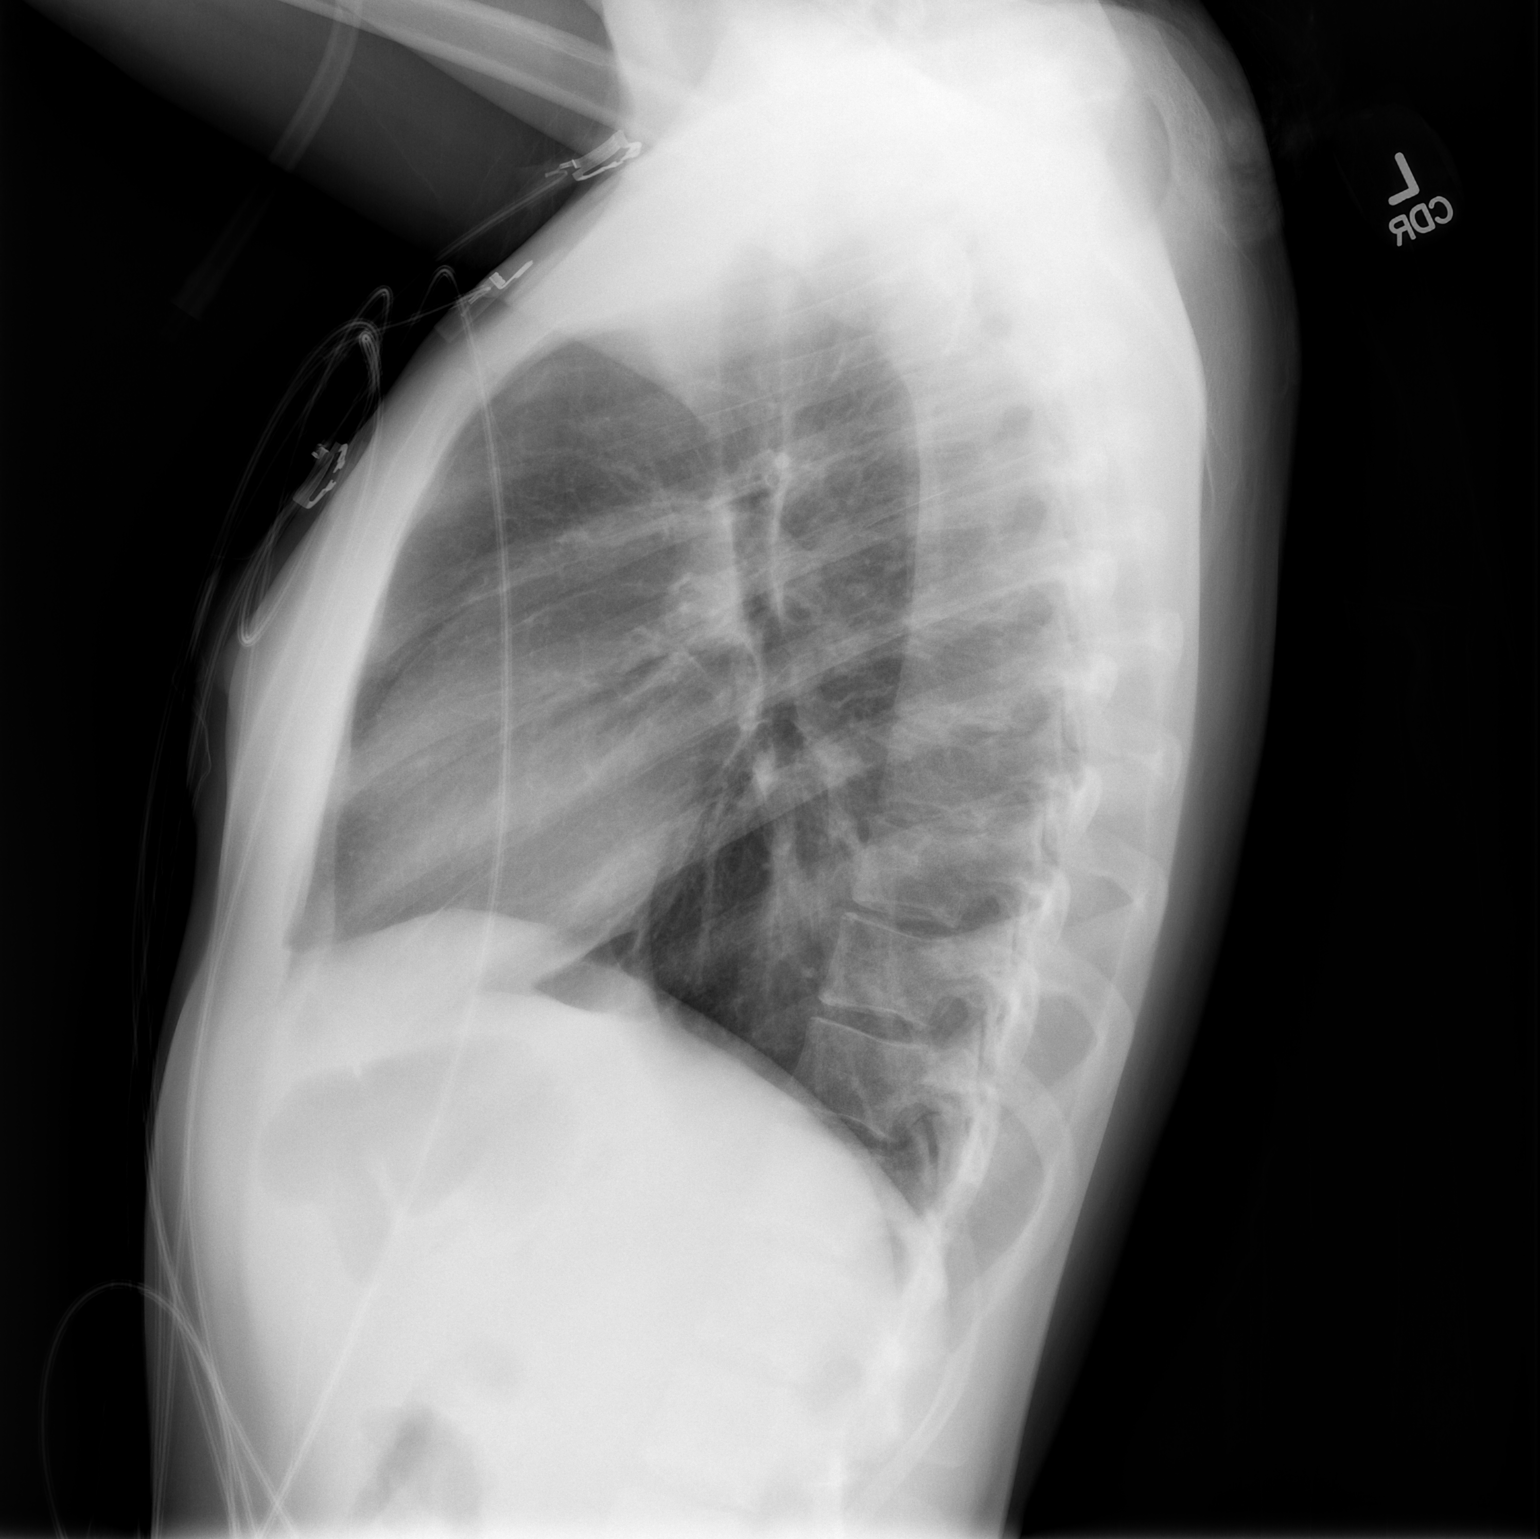

[2 of 2 positions shown; findings below may reference images not displayed]

FINDINGS: The heart size and mediastinal contours are within normal limits.
Both lungs are clear. The visualized skeletal structures are
unremarkable.
IMPRESSION: No active cardiopulmonary disease.

## 2019-07-31 ENCOUNTER — Ambulatory Visit: Payer: BC Managed Care – PPO | Admitting: Psychiatry

## 2019-08-27 ENCOUNTER — Ambulatory Visit (INDEPENDENT_AMBULATORY_CARE_PROVIDER_SITE_OTHER): Payer: BC Managed Care – PPO | Admitting: Psychiatry

## 2019-08-27 ENCOUNTER — Other Ambulatory Visit: Payer: Self-pay

## 2019-08-27 ENCOUNTER — Ambulatory Visit: Payer: BC Managed Care – PPO | Admitting: Psychiatry

## 2019-08-27 DIAGNOSIS — F411 Generalized anxiety disorder: Secondary | ICD-10-CM | POA: Diagnosis not present

## 2019-08-27 NOTE — Progress Notes (Signed)
      [facility] Counselor/Therapist Progress Note  Patient ID: Jade Mcintosh, MRN: [id number],    Date: 08/27/2019  Time Spent: 51 minutes start time 5:02 PM end time 5:53 PM  Treatment Type: Individual Therapy  Reported Symptoms: anxiety, sadness, sleep issues, weight gain, obsessive thinking, irritability  Mental Status Exam:  Appearance:   Well Groomed     Behavior:  Sharing  Motor:  Restlestness  Speech/Language:   Normal Rate  Affect:  Appropriate  Mood:  anxious  Thought process:  normal  Thought content:    WNL  Sensory/Perceptual disturbances:    WNL  Orientation:  oriented to person, place, time/date and situation  Attention:  Good  Concentration:  Good  Memory:  WNL  Fund of knowledge:   Fair  Insight:    Fair  Judgment:   Good  Impulse Control:  Good   Risk Assessment: Danger to Self:  No Self-injurious Behavior: No Danger to Others: No Duty to Warn:no Physical Aggression / Violence:No  Access to Firearms a concern: No  Gang Involvement:No   Subjective: Patient was present for session.  She shared she was doing well for a while but since she stopped having anything to do at all she has started thinking too much and has noticed her anxiety starting again. She shared that she has no motivation.  She finally joined the gym and that has been better.  Patient is going to the doctor to get her hormone level tested because she has been working out and decreased caloric intake and she is gaining weight. Rapidly.She shared it is making her very anxious.  She explained that she feels everything is off with her body.  She explained she has stopped taking sleeping pills and now she is not sleeping at all.  Encourage patient to recognize she has to get some rest and so maybe it is time to talk with a provider  again to see what can be done concerning potential made medication.  Patient agreed to make an appointment.  Patient was encouraged to think through different  things that she can do currently to help herself feel better.  She admitted that she has been isolating and realizes that that is not healthy.  Discussed the importance of engaging with other people and how she does much better when she is with her friends.  Discussed CBT skills to talk her self through it and the importance of not giving herself an option but setting up opportunities for her to engage with others.  Patient was able to think through different possibilities and come up with some plans that she felt would be helpful.  Interventions: Cognitive Behavioral Therapy and Solution-Oriented/Positive Psychology  Diagnosis:   ICD-10-CM   1. Generalized anxiety disorder  F41.1     Plan: Patient is to continue utilizing CBT and coping skills to decrease anxiety symptoms.  Patient is to meet with provider to talk about potential medications.  She is to get her hormonal levels checked with her physician.  Patient is also to go out with friends even if it is just for short periods of time. Long term goal: Resolve the core conflict that is the source of anxiety Short term goal: Identify the major life complex from the past and present the form the basis for present anxiety  Lina Sayre, [facility]

## 2019-09-03 ENCOUNTER — Other Ambulatory Visit: Payer: Self-pay

## 2019-09-03 ENCOUNTER — Ambulatory Visit (INDEPENDENT_AMBULATORY_CARE_PROVIDER_SITE_OTHER): Payer: BC Managed Care – PPO | Admitting: Physician Assistant

## 2019-09-03 ENCOUNTER — Encounter: Payer: Self-pay | Admitting: Physician Assistant

## 2019-09-03 DIAGNOSIS — F5082 Avoidant/restrictive food intake disorder: Secondary | ICD-10-CM

## 2019-09-03 DIAGNOSIS — F411 Generalized anxiety disorder: Secondary | ICD-10-CM | POA: Diagnosis not present

## 2019-09-03 MED ORDER — HYDROXYZINE HCL 10 MG PO TABS: 10.0000 mg | ORAL_TABLET | Freq: Four times a day (QID) | ORAL | 1 refills | Status: AC | PRN

## 2019-09-03 NOTE — Progress Notes (Signed)
[facility] Med Check  Patient ID: Jade Mcintosh,  MRN: [id number]  PCP: Johny Blamer, MD  Date of Evaluation: 09/03/2019 Time spent:15 minutes  Chief Complaint:  Chief Complaint    Follow-up      HISTORY/CURRENT STATUS: HPI  To discuss anxiety.  Has been more anxious over the past 2 months.  Up until that time, she had been doing well since stopping the Zoloft earlier in the year.  She stopped drinking any caffeine about the same time. She started gaining weight around the same time too.  In the past few months, she started counting calories, and has restricted herself to 1200 cal/day and has continued to work out for 2 hours/d for 6 days per week for years.  Reports and 8 pound weight gain in the past month to 6 weeks.  Feels that a lot of the anxiety is coming from the fact that she is gaining weight.  Had an eating disorder when in high school.  She restricted her calories a lot but does not tell me how restrictive she was.  She worked out for 4 hours a day most days and on weekends she would dance 10 hours a day.  She did not take laxatives or purge at all.  She did not have to be hospitalized.  Patient denies loss of interest in usual activities and is able to enjoy things.  Denies decreased energy or motivation.  Appetite has not changed.  No extreme sadness, tearfulness, or feelings of hopelessness.  Denies any changes in concentration, making decisions or remembering things.  Denies suicidal or homicidal thoughts.  Patient denies increased energy with decreased need for sleep, no increased talkativeness, no racing thoughts, no impulsivity or risky behaviors, no increased spending, no increased libido, no grandiosity.  Denies muscle or joint pain, stiffness, or dystonia.Denies dizziness, syncope, seizures, numbness, tingling, tremor, tics, unsteady gait, slurred speech, confusion.   Individual Medical History/ Review of Systems: Changes? :No    Past medications for  mental health diagnoses include: Zoloft stopped because of patient's preference.  Allergies: Venlafaxine  Current Medications:  Current Outpatient Medications:  .  cholecalciferol (VITAMIN D) 1000 units tablet, Take 1,000 Units by mouth daily., Disp: , Rfl:  .  naproxen sodium (ALEVE) 220 MG tablet, Take 220 mg by mouth., Disp: , Rfl:  .  Norgestimate-Ethinyl Estradiol Triphasic (TRI-SPRINTEC) 0.18/0.215/0.25 MG-35 MCG tablet, Take 1 tablet by mouth daily., Disp: , Rfl:  .  vitamin B-12 (CYANOCOBALAMIN) 100 MCG tablet, Take 100 mcg by mouth daily., Disp: , Rfl:  .  hydrOXYzine (ATARAX/VISTARIL) 10 MG tablet, Take 1-3 tablets (10-30 mg total) by mouth every 6 (six) hours as needed for anxiety., Disp: 60 tablet, Rfl: 1 .  propranolol (INDERAL) 10 MG tablet, Take 1-2 tablets (10-20 mg total) by mouth 2 (two) times daily as needed. (Patient not taking: Reported on 03/26/2019), Disp: 60 tablet, Rfl: 2 .  sertraline (ZOLOFT) 100 MG tablet, TAKE 1.5 TABLETS BY MOUTH DAILY (Patient not taking: Reported on 03/26/2019), Disp: 45 tablet, Rfl: 0 Medication Side Effects: none  Family Medical/ Social History: Changes? No  MENTAL HEALTH EXAM:  There were no vitals taken for this visit.There is no height or weight on file to calculate BMI.  General Appearance: Casual, Neat and Well Groomed  Eye Contact:  Good  Speech:  Clear and Coherent  Volume:  Normal  Mood:  Euthymic  Affect:  Appropriate  Thought Process:  Goal Directed  Orientation:  Full (Time, Place, and Person)  Thought Content: Logical   Suicidal Thoughts:  No  Homicidal Thoughts:  No  Memory:  WNL  Judgement:  Good  Insight:  Good  Psychomotor Activity:  Normal  Concentration:  Concentration: Good  Recall:  Good  Fund of Knowledge: Good  Language: Good  Assets:  Desire for Improvement  ADL's:  Intact  Cognition: WNL  Prognosis:  Good    DIAGNOSES:    ICD-10-CM   1. Generalized anxiety disorder  F41.1   2.  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  She does not want to be on medication that she has to take every day and especially something that has the potential of causing increased hunger and weight gain. Hydroxyzine would be the best option for her.  She can take it as needed, it does not cause weight gain, and it is not habit forming.  Another good choice would be BuSpar but she would have to take that on a daily basis.  She prefers not to do that at this time.  She would like to try the hydroxyzine.  We discussed the benefits, risks, side effects and she accepts. Start hydroxyzine 10 mg, 1-3 p.o. every 6 hours as needed anxiety.  Sedation precautions were discussed. Continue therapy with Lina Sayre, [facility].  I am concerned that the eating disorder is recurring and I would like for her to discuss that with [facility]. Return in 4 to 6 weeks.  Donnal Moat, PA-C

## 2019-09-06 ENCOUNTER — Ambulatory Visit (INDEPENDENT_AMBULATORY_CARE_PROVIDER_SITE_OTHER): Payer: BC Managed Care – PPO | Admitting: Psychiatry

## 2019-09-06 ENCOUNTER — Other Ambulatory Visit: Payer: Self-pay

## 2019-09-06 DIAGNOSIS — F411 Generalized anxiety disorder: Secondary | ICD-10-CM | POA: Diagnosis not present

## 2019-09-06 NOTE — Progress Notes (Signed)
[facility] Counselor/Therapist Progress Note  Patient ID: Jade Mcintosh, MRN: [id number],    Date: 09/06/2019  Time Spent: 52 minutes start time 2:05 PM end time 2:57 PM  Treatment Type: Individual Therapy  Reported Symptoms: anxiety, sadness, frustration, fatigue  Mental Status Exam:  Appearance:   Well Groomed     Behavior:  Sharing  Motor:  Restlestness  Speech/Language:   Normal Rate  Affect:  Appropriate  Mood:  anxious  Thought process:  normal  Thought content:    Rumination  Sensory/Perceptual disturbances:    WNL  Orientation:  oriented to person, place, time/date and situation  Attention:  Good  Concentration:  Good  Memory:  WNL  Fund of knowledge:   Good  Insight:    Fair  Judgment:   Good  Impulse Control:  Good   Risk Assessment: Danger to Self:  No Self-injurious Behavior: No Danger to Others: No Duty to Warn:no Physical Aggression / Violence:No  Access to Firearms a concern: No  Gang Involvement:No   Subjective: Patient was present for session.  She reported that she is continuing to struggle with weight issues and it is making it hard on her.  Patient reported that she had gone to her physician and he had asked her the possibility of a thyroid issue.  She shared that that is an issue in her family and when he read the foods that were bad for the thyroid she reported that most of those foods are what she typically eats.  She also shared at that point he did some blood work so she is hoping to get some information soon.  Patient shared she is continuing to gain weight even though she is only eating less than 1000 cal a day and it is creating lots of stress for her.  Encourage patient to recognize that that that she has to get in the right nutrients for her brain and that does not mean eating high caloric foods but at least a lot of berries and a lot of broccoli and spinach and foods that are going to help with her serotonin.  Patient agreed to work and  eating those foods as much as possible.  She also shared she had talked with her provider Donnal Moat PA-C about her medication but she did not share all that was discussed in session.  She agreed to let clinician talk to her about the things addressed.  Patient did EMDR set on her eating, suds level 8, negative cognition "I am going to gain weight" felt guilt and anxiety in her head.  Patient was only able to reduce suds level to 7.  She acknowledged the fact that with her physical situation it is hard to see that that will change.  Patient was encouraged to continue working with her physician to see if they can figure out what is going on with her physically.     Interventions: Solution-Oriented/Positive Psychology and Eye Movement Desensitization and Reprocessing (EMDR)  Diagnosis:   ICD-10-CM   1. Generalized anxiety disorder  F41.1     Plan: Patient is to utilize CBT and coping skills to manage anxiety symptoms appropriately.  Patient is to work on reminding herself that she is enough and to try and eat more foods that are getting help decrease her anxiety symptoms.  Patient is to continue working with her physician to see why she is still gaining weight when she is not eating enough calories. Long term goal: Resolve the  core conflict that is the source of anxiety Short-term goal: Identified the major life complex from the past and present that form the basis for present anxiety  Stevphen Meuse, [facility]

## 2019-10-07 ENCOUNTER — Ambulatory Visit: Payer: BC Managed Care – PPO | Admitting: Physician Assistant

## 2019-10-18 ENCOUNTER — Ambulatory Visit (INDEPENDENT_AMBULATORY_CARE_PROVIDER_SITE_OTHER): Payer: BC Managed Care – PPO | Admitting: Physician Assistant

## 2019-10-18 ENCOUNTER — Encounter: Payer: Self-pay | Admitting: Physician Assistant

## 2019-10-18 ENCOUNTER — Encounter (INDEPENDENT_AMBULATORY_CARE_PROVIDER_SITE_OTHER): Payer: Self-pay

## 2019-10-18 ENCOUNTER — Other Ambulatory Visit: Payer: Self-pay

## 2019-10-18 DIAGNOSIS — F329 Major depressive disorder, single episode, unspecified: Secondary | ICD-10-CM | POA: Diagnosis not present

## 2019-10-18 DIAGNOSIS — F411 Generalized anxiety disorder: Secondary | ICD-10-CM

## 2019-10-18 DIAGNOSIS — F32A Depression, unspecified: Secondary | ICD-10-CM

## 2019-10-18 MED ORDER — VIIBRYD 20 MG PO TABS: 20.0000 mg | ORAL_TABLET | Freq: Every day | ORAL | 1 refills | Status: AC

## 2019-10-18 NOTE — Progress Notes (Signed)
[facility] Check  Patient ID: Jade Mcintosh,  MRN: [id number]  PCP: Johny Blamer, MD  Date of Evaluation: 10/18/2019 Time spent:20 minutes  Chief Complaint:  Chief Complaint    Anxiety      HISTORY/CURRENT STATUS: HPI for routine med check.  At the last visit, we started hydroxyzine to help with anxiety.  States it is working and she feels better as far as the anxiety goes.  When she starts feeling a panic attack, mom, she takes the hydroxyzine and it relieves her symptoms.  It does not make her too sleepy to take and still function.  States she has seasonal depression.  It has been this way for many years.  "I definitely feel better in the hot summer months."  She feels "down" and not enjoying things as much as she normally would.  This is been going on for a couple of months now.  Her energy and motivation are also low but not affecting her schoolwork.  She is in school at [employer] and not missing classes very often due to these feelings.  Appetite is normal.  She has not lost or gained a lot of weight.  Reports eating good and not binging or purging.  Her memory is good.  She is able to focus and complete tasks.  She sleeps well most of the time.  She denies suicidal or homicidal thoughts.  Patient denies increased energy with decreased need for sleep, no increased talkativeness, no racing thoughts, no impulsivity or risky behaviors, no increased spending, no increased libido, no grandiosity.  Review of Systems  Constitutional: Negative.   HENT: Negative.   Eyes: Negative.   Respiratory: Negative.   Cardiovascular: Negative.   Gastrointestinal: Negative.   Genitourinary: Negative.   Musculoskeletal: Negative.   Skin: Negative.   Neurological: Negative.   Endo/Heme/Allergies: Negative.   Psychiatric/Behavioral: Positive for depression. Negative for hallucinations, memory loss, substance abuse and suicidal ideas. The patient is nervous/anxious. The patient  does not have insomnia.    Individual Medical History/ Review of Systems: Changes? :No    Past medications for mental health diagnoses include: Zoloft stopped b/c it increased, Effexor  Allergies: Venlafaxine  Current Medications:  Current Outpatient Medications:  .  cholecalciferol (VITAMIN D) 1000 units tablet, Take 1,000 Units by mouth daily., Disp: , Rfl:  .  hydrOXYzine (ATARAX/VISTARIL) 10 MG tablet, Take 1-3 tablets (10-30 mg total) by mouth every 6 (six) hours as needed for anxiety., Disp: 60 tablet, Rfl: 1 .  naproxen sodium (ALEVE) 220 MG tablet, Take 220 mg by mouth., Disp: , Rfl:  .  Norgestimate-Ethinyl Estradiol Triphasic (TRI-SPRINTEC) 0.18/0.215/0.25 MG-35 MCG tablet, Take 1 tablet by mouth daily., Disp: , Rfl:  .  vitamin B-12 (CYANOCOBALAMIN) 100 MCG tablet, Take 100 mcg by mouth daily., Disp: , Rfl:  .  Vilazodone HCl (VIIBRYD) 20 MG TABS, Take 1 tablet (20 mg total) by mouth daily., Disp: 30 tablet, Rfl: 1 Medication Side Effects: none  Family Medical/ Social History: Changes? No  MENTAL HEALTH EXAM:  There were no vitals taken for this visit.There is no height or weight on file to calculate BMI.  General Appearance: Casual, Neat and Well Groomed  Eye Contact:  Good  Speech:  Clear and Coherent  Volume:  Normal  Mood:  Depressed  Affect:  Appropriate  Thought Process:  Goal Directed and Descriptions of Associations: Intact  Orientation:  Full (Time, Place, and Person)  Thought Content: Logical   Suicidal Thoughts:  No  Homicidal Thoughts:  No  Memory:  WNL  Judgement:  Good  Insight:  Good  Psychomotor Activity:  Normal  Concentration:  Concentration: Good and Attention Span: Good  Recall:  Good  Fund of Knowledge: Good  Language: Good  Assets:  Desire for Improvement  ADL's:  Intact  Cognition: WNL  Prognosis:  Good    DIAGNOSES:    ICD-10-CM   1. Depression, unspecified depression type  F32.9   2. Generalized anxiety disorder  F41.1      Receiving Psychotherapy: Yes With Lina Sayre, [facility]   RECOMMENDATIONS:  PDMP was reviewed. I spent 20 minutes with her. We discussed different options for depression.  She has taken Zoloft and Effexor in the past.  She did not like the side effects from either.  She is most concerned about weight gain.  She also did not like having the sweats.  I recommend we start Viibryd.  The benefits, risks, side effects were discussed and she accepts. Start Viibryd starter pack was given.  She will take 10 mg daily for 1 week and then 20 mg daily.  Prescription for 20 mg was sent in.  Co-pay card was given. Continue hydroxyzine 10 mg, 1-3 p.o. every 6 hours as needed anxiety. Continue therapy with Lina Sayre, [facility]. Return in 6 weeks.  Donnal Moat, PA-C

## 2019-11-28 ENCOUNTER — Ambulatory Visit: Payer: BC Managed Care – PPO | Admitting: Physician Assistant

## 2021-05-23 ENCOUNTER — Other Ambulatory Visit: Payer: Self-pay

## 2021-05-23 ENCOUNTER — Ambulatory Visit (HOSPITAL_COMMUNITY)
Admission: EM | Admit: 2021-05-23 | Discharge: 2021-05-23 | Disposition: A | Discharge status: Home / Self Care | Payer: BC Managed Care – PPO

## 2021-05-23 DIAGNOSIS — F411 Generalized anxiety disorder: Secondary | ICD-10-CM

## 2021-05-23 NOTE — ED Provider Notes (Signed)
[facility] Urgent Care Medical Screening Exam  Patient Name: Jade Mcintosh MRN: [id number] Date of Evaluation: 05/23/21 Chief Complaint:   Diagnosis:  Final diagnoses:  GAD (generalized anxiety disorder)    History of Present illness: Jade Mcintosh is a 25 y.o. female presents to [facility] urgent care requesting additional outpatient resources for therapy and/or psychiatry.  Reports more recently she has been having stress/panic attacks while driving.  Denies any traumatic experience related to auto accidents.  Reports history with depression, anxiety and anorexia.  Reports she was diagnosed with  a eating disorder at the age of 65.  States she was prescribed a low-dose of Paxil which she felt made her " more crazy."  She denied that she is interested in taking any more antidepressants at this time.  [name] states she feels like she is having out of body experience and depersonalization after she has a panic attack.  Stated her symptoms started intensifying about 1 week ago.    She denied suicidal or homicidal ideations.  Denies auditory or visual hallucinations.  Denied that she has been followed by therapy or psychiatry currently. States she was followed by Nutritionist  and psychotherapy for her eating disorder however has since discontinued this service.  Patient reports employee assistance program (EAP) referred her for a mental health evaluation.   NP discussed partial hospitalization programming and to contact insurance card for psychiatry/ therapy services by  [employer].  Patient was receptive to plan.  Support, encouragement and reassurance was provided.   Jade Mcintosh, 25 y.o., female patient seen face to face by this provider, consulted with Dr. Janna Arch ; and chart reviewed on 05/23/21.  On evaluation Jade Mcintosh reports  During evaluation Jade Mcintosh is sitting in no acute distress. She is alert/oriented x 4; calm/cooperative;  and mood congruent with affect. She is speaking in a clear tone at moderate volume, and normal pace; with good eye contact. Her thought process is coherent and relevant; There is no indication that she is currently responding to internal/external stimuli or experiencing delusional thought content; and she has denied suicidal/self-harm/homicidal ideation, psychosis, and paranoia.   Patient has remained calm throughout assessment and has answered questions appropriately.     At this time Jade Mcintosh is educated and verbalizes understanding of mental health resources and other crisis services in the community. She is instructed to call 911 and present to the nearest emergency room should she experience any suicidal/homicidal ideation, auditory/visual/hallucinations, or detrimental worsening of  her mental health condition. She was a also advised by Clinical research associate that she could call the toll-free phone on insurance card to assist with identifying in network counselors and agencies or number.     Psychiatric Specialty Exam  Presentation  General Appearance:Appropriate for Environment  Eye Contact:Good  Speech:Clear and Coherent  Speech Volume:Normal  Handedness:Right   Mood and Affect  Mood:Anxious; Depressed  Affect:Appropriate; Depressed   Thought Process  Thought Processes:Coherent  Descriptions of Associations:Intact  Orientation:Full (Time, Place and Person)  Thought Content:Logical    Hallucinations:None  Ideas of Reference:None  Suicidal Thoughts:No  Homicidal Thoughts:No   Sensorium  Memory:Immediate Good; Recent Good; Remote Good  Judgment:Fair  Insight:Fair   Executive Functions  Concentration:Fair  Attention Span:Good  Recall:Good  Fund of Knowledge:Good  Language:Good   Psychomotor Activity  Psychomotor Activity:Normal   Assets  Assets:Social Support; Desire for Improvement; Intimacy   Sleep  Sleep:Fair  Number of hours:  No data  recorded  Nutritional Assessment (For OBS and [facility]BC admissions only) Has the patient had a weight loss or gain of 10 pounds or more in the last 3 months?: No Has the patient had a decrease in food intake/or appetite?: No Does the patient have dental problems?: No Does the patient have eating habits or behaviors that may be indicators of an eating disorder including binging or inducing vomiting?: No Has the patient recently lost weight without trying?: 0 Has the patient been eating poorly because of a decreased appetite?: 0 Malnutrition Screening Tool Score: 0   Physical Exam: Physical Exam Vitals reviewed.  HENT:     Head: Normocephalic.     Nose: Nose normal.  Eyes:     Pupils: Pupils are equal, round, and reactive to light.  Neurological:     Mental Status: She is alert.  Psychiatric:        Mood and Affect: Mood normal.        Behavior: Behavior normal.        Thought Content: Thought content normal.        Judgment: Judgment normal.   Review of Systems  HENT: Negative.    Cardiovascular: Negative.   Gastrointestinal: Negative.   Genitourinary: Negative.   Musculoskeletal: Negative.   Skin: Negative.   Psychiatric/Behavioral:  Negative for depression and suicidal ideas. The patient is nervous/anxious.   All other systems reviewed and are negative. Blood pressure 114/83, pulse 80, temperature 98.3 F (36.8 C), temperature source Oral, resp. rate 18, SpO2 99 %. There is no height or weight on file to calculate BMI.  Musculoskeletal: Strength & Muscle Tone: within normal limits Gait & Station: normal Patient leans: N/A   [facility] MSE Discharge Disposition for Follow up and Recommendations: Based on my evaluation the patient does not appear to have an emergency medical condition and can be discharged with resources and follow up care in outpatient services for Medication Management, Partial Hospitalization Program, Individual Therapy, and Group Therapy   Jade Rack,  NP 05/23/2021, 4:52 PM

## 2021-05-23 NOTE — Discharge Instructions (Signed)
Take all medications as prescribed. Keep all follow-up appointments as scheduled.  Do not consume alcohol or use illegal drugs while on prescription medications. Report any adverse effects from your medications to your primary care provider promptly.  In the event of recurrent symptoms or worsening symptoms, call 911, a crisis hotline, or go to the nearest emergency department for evaluation.   

## 2021-05-23 NOTE — ED Notes (Signed)
Pt discharged in no acute distress. AVS printed and given to pt via NP. Safety maintained.

## 2021-05-23 NOTE — BH Assessment (Addendum)
Jade Mcintosh, Routine, MR #[id number]; 25 years old presents this date with her mother Richele Strand, [phone].  Pt denied SI, HI, or AVH.  Pt reports experiencing panic attacks, "I feel out of it, out of touch while driving".  Pt reports prior mental health diagnosis (anorexia); currently not taking medication for symptom management.  MSE signed by patient.
